# Patient Record
Sex: Male | Born: 1940 | Race: White | Hispanic: No | Smoking: Current every day smoker
Health system: Southern US, Community
[De-identification: ages and names within clinical notes are randomized; demographics above are authoritative.]

## PROBLEM LIST (undated history)

## (undated) DIAGNOSIS — E785 Hyperlipidemia, unspecified: Secondary | ICD-10-CM

## (undated) DIAGNOSIS — I2510 Atherosclerotic heart disease of native coronary artery without angina pectoris: Secondary | ICD-10-CM

## (undated) DIAGNOSIS — I739 Peripheral vascular disease, unspecified: Secondary | ICD-10-CM

## (undated) DIAGNOSIS — I82409 Acute embolism and thrombosis of unspecified deep veins of unspecified lower extremity: Secondary | ICD-10-CM

## (undated) DIAGNOSIS — I480 Paroxysmal atrial fibrillation: Secondary | ICD-10-CM

## (undated) DIAGNOSIS — I422 Other hypertrophic cardiomyopathy: Secondary | ICD-10-CM

## (undated) HISTORY — PX: CATARACT EXTRACTION: SUR2

## (undated) HISTORY — DX: Atherosclerotic heart disease of native coronary artery without angina pectoris: I25.10

## (undated) HISTORY — PX: TONSILLECTOMY: SUR1361

## (undated) HISTORY — DX: Peripheral vascular disease, unspecified: I73.9

## (undated) HISTORY — PX: EYE SURGERY: SHX253

## (undated) HISTORY — DX: Hyperlipidemia, unspecified: E78.5

## (undated) HISTORY — PX: COLONOSCOPY: SHX174

## (undated) HISTORY — DX: Paroxysmal atrial fibrillation: I48.0

---

## 2008-12-01 ENCOUNTER — Ambulatory Visit: Payer: Self-pay | Admitting: Cardiology

## 2008-12-01 ENCOUNTER — Inpatient Hospital Stay (HOSPITAL_COMMUNITY): Admission: EM | Admit: 2008-12-01 | Discharge: 2008-12-03 | Payer: Self-pay | Admitting: Internal Medicine

## 2008-12-05 DIAGNOSIS — I82409 Acute embolism and thrombosis of unspecified deep veins of unspecified lower extremity: Secondary | ICD-10-CM

## 2008-12-05 HISTORY — DX: Acute embolism and thrombosis of unspecified deep veins of unspecified lower extremity: I82.409

## 2008-12-22 ENCOUNTER — Ambulatory Visit: Payer: Self-pay | Admitting: Cardiology

## 2008-12-22 ENCOUNTER — Encounter: Payer: Self-pay | Admitting: Cardiology

## 2008-12-22 ENCOUNTER — Ambulatory Visit: Payer: Self-pay

## 2008-12-22 LAB — CONVERTED CEMR LAB
Free T4: 1 ng/dL (ref 0.6–1.6)
TSH: 4.85 microintl units/mL (ref 0.35–5.50)

## 2008-12-23 ENCOUNTER — Ambulatory Visit: Payer: Self-pay | Admitting: Cardiology

## 2008-12-23 ENCOUNTER — Ambulatory Visit: Payer: Self-pay

## 2009-01-01 ENCOUNTER — Ambulatory Visit: Payer: Self-pay | Admitting: Cardiovascular Disease

## 2009-01-07 ENCOUNTER — Ambulatory Visit (HOSPITAL_COMMUNITY): Admission: RE | Admit: 2009-01-07 | Discharge: 2009-01-07 | Payer: Self-pay | Admitting: Cardiovascular Disease

## 2009-01-07 ENCOUNTER — Ambulatory Visit: Payer: Self-pay | Admitting: Cardiovascular Disease

## 2009-01-15 ENCOUNTER — Ambulatory Visit: Payer: Self-pay

## 2009-01-15 ENCOUNTER — Ambulatory Visit: Payer: Self-pay | Admitting: Cardiovascular Disease

## 2009-06-22 ENCOUNTER — Encounter (INDEPENDENT_AMBULATORY_CARE_PROVIDER_SITE_OTHER): Payer: Self-pay | Admitting: *Deleted

## 2009-07-31 DIAGNOSIS — I2510 Atherosclerotic heart disease of native coronary artery without angina pectoris: Secondary | ICD-10-CM | POA: Insufficient documentation

## 2009-07-31 DIAGNOSIS — I739 Peripheral vascular disease, unspecified: Secondary | ICD-10-CM | POA: Insufficient documentation

## 2009-07-31 DIAGNOSIS — E785 Hyperlipidemia, unspecified: Secondary | ICD-10-CM | POA: Insufficient documentation

## 2009-07-31 DIAGNOSIS — I10 Essential (primary) hypertension: Secondary | ICD-10-CM | POA: Insufficient documentation

## 2009-07-31 DIAGNOSIS — F17200 Nicotine dependence, unspecified, uncomplicated: Secondary | ICD-10-CM

## 2009-10-19 ENCOUNTER — Encounter: Payer: Self-pay | Admitting: Cardiovascular Disease

## 2009-10-19 ENCOUNTER — Ambulatory Visit: Payer: Self-pay

## 2009-10-23 ENCOUNTER — Telehealth: Payer: Self-pay | Admitting: Cardiovascular Disease

## 2009-11-09 ENCOUNTER — Ambulatory Visit: Payer: Self-pay | Admitting: Cardiovascular Disease

## 2009-11-09 ENCOUNTER — Encounter (INDEPENDENT_AMBULATORY_CARE_PROVIDER_SITE_OTHER): Payer: Self-pay

## 2009-11-18 ENCOUNTER — Ambulatory Visit: Payer: Self-pay | Admitting: Cardiovascular Disease

## 2009-11-18 ENCOUNTER — Ambulatory Visit (HOSPITAL_COMMUNITY): Admission: RE | Admit: 2009-11-18 | Discharge: 2009-11-18 | Payer: Self-pay | Admitting: Cardiovascular Disease

## 2009-12-22 ENCOUNTER — Ambulatory Visit (HOSPITAL_COMMUNITY): Admission: RE | Admit: 2009-12-22 | Discharge: 2009-12-22 | Payer: Self-pay | Admitting: Cardiology

## 2009-12-22 ENCOUNTER — Ambulatory Visit: Payer: Self-pay | Admitting: Cardiology

## 2009-12-22 ENCOUNTER — Ambulatory Visit: Payer: Self-pay

## 2009-12-22 ENCOUNTER — Encounter: Payer: Self-pay | Admitting: Cardiology

## 2011-01-04 NOTE — Cardiovascular Report (Signed)
Summary: Pre-Cath Orders  Pre-Cath Orders   Imported By: Marylou Mccoy 12/19/2009 13:00:04  _____________________________________________________________________  External Attachment:    Type:   Image     Comment:   External Document

## 2011-03-02 ENCOUNTER — Other Ambulatory Visit: Payer: Self-pay | Admitting: Cardiovascular Disease

## 2011-03-03 NOTE — Telephone Encounter (Signed)
[address] °

## 2011-03-04 ENCOUNTER — Other Ambulatory Visit: Payer: Self-pay | Admitting: *Deleted

## 2011-03-08 LAB — CBC
HCT: 40.9 % (ref 39.0–52.0)
Platelets: 275 10*3/uL (ref 150–400)
RBC: 4.19 MIL/uL — ABNORMAL LOW (ref 4.22–5.81)
WBC: 11.1 10*3/uL — ABNORMAL HIGH (ref 4.0–10.5)

## 2011-03-08 LAB — BASIC METABOLIC PANEL
BUN: 12 mg/dL (ref 6–23)
Creatinine, Ser: 0.81 mg/dL (ref 0.4–1.5)
GFR calc Af Amer: >60 mL/min (ref >60)
GFR calc non Af Amer: >60 mL/min (ref >60)
Potassium: 3.5 mEq/L (ref 3.5–5.1)

## 2011-03-08 LAB — PROTIME-INR: Prothrombin Time: 13.2 seconds (ref 11.6–15.2)

## 2011-03-19 ENCOUNTER — Other Ambulatory Visit: Payer: Self-pay | Admitting: Cardiovascular Disease

## 2011-03-22 LAB — CBC
Hemoglobin: 13.5 g/dL (ref 13.0–17.0)
RBC: 4.28 MIL/uL (ref 4.22–5.81)

## 2011-03-22 LAB — PROTIME-INR: INR: 1.1 (ref 0.00–1.49)

## 2011-03-22 LAB — BASIC METABOLIC PANEL
Calcium: 9.1 mg/dL (ref 8.4–10.5)
GFR calc Af Amer: >60 mL/min (ref >60)
GFR calc non Af Amer: >60 mL/min (ref >60)
Sodium: 140 mEq/L (ref 135–145)

## 2011-04-18 ENCOUNTER — Other Ambulatory Visit: Payer: Self-pay | Admitting: Cardiovascular Disease

## 2011-04-18 ENCOUNTER — Other Ambulatory Visit: Payer: Self-pay | Admitting: Cardiology

## 2011-04-18 MED ORDER — SIMVASTATIN 40 MG PO TABS: 40.0000 mg | ORAL_TABLET | Freq: Every day | ORAL | Status: DC

## 2011-04-19 ENCOUNTER — Other Ambulatory Visit: Payer: Self-pay | Admitting: *Deleted

## 2011-04-19 MED ORDER — AMLODIPINE BESYLATE 10 MG PO TABS: 10.0000 mg | ORAL_TABLET | Freq: Every day | ORAL | Status: DC

## 2011-04-19 NOTE — Assessment & Plan Note (Signed)
[facility]                            CARDIOLOGY OFFICE NOTE   David Pacheco, David Pacheco                         MRN:          [id number]  DATE:12/22/2008                            DOB:          02-14-1941    PRIMARY CARE PHYSICIAN:  Dr. Kathlee Nations in [place], [place].   HISTORY OF PRESENT ILLNESS:  This is a 70 year old with history of  hypertension and tobacco abuse who presents to Cardiology Clinic for  followup after recent hospitalization for chest pain and tightness.  In  December 2009, the patient began to develop episodes of substernal chest  squeezing.  The chest pain was on and off and would occur multiple times  a day, would last for 10-20 minutes at a time.  This went on for 1-2  weeks.  It was not related to exertion.  The patient presented to the  emergency department here at [facility].  He was found to have  significant dynamic anterolateral ST depression and profound T-wave  inversion.  He was admitted.  He was ruled out for myocardial  infarction.  He did have a left heart catheterization done while he was  in the hospital which showed nonobstructive coronary disease with a  normal EF.  It was thought that perhaps the patient had coronary  vasospasm.  He was put on Norvasc and Imdur and discharged.  Since the  time of the discharge, the patient had only 1 episode of the squeezing  chest tightness.  This was last week.  It lasted only for about 10  seconds, and he has had none since or before.  He does not get any  exertional chest symptoms, and he does not get short of breath with  exertion.  The patient did have an echocardiogram done today which shows  normal LV size and function.  There is no evidence that I can see for  apical hypertrophy which could have also given him the profound T-wave  inversions that he has on the EKG.  He was noted to have a possibly  bicuspid aortic valve.  Finally, the patient does complain of  tightness  in his thighs with moderate exertion.  He says when he is out walking in  the woods hunting he will often 10-15 minutes into a walk in the woods  get significant tightness in his thighs.  He thinks perhaps it is worse  on the left.  This has been going on for at least a couple of years now.  Of note, the patient also does continue to smoke a pack of cigarettes  every 3 days.   PAST MEDICAL HISTORY  1. Hypertension.  2. Tobacco abuse.  3. Chest pain.  The patient did present to the hospital with chest      pain in December 2009.  Left heart catheterization showed separate      ostia of the LAD and the circumflex and nonobstructive coronary      disease.  EF was 65%.  He had an echocardiogram done in January      2010 showing a possible  bicuspid aortic valve with no aortic      stenosis or aortic regurgitation.  Mitral valve was normal.  There      was normal LV systolic and diastolic function.  There was normal RV      size and function.  Pulmonary artery systolic pressure was normal.      The IVC was normal.  There was no evidence for LV apical      hypertrophy either.   MEDICATIONS:  1. Aspirin 325 mg daily.  2. Norvasc 5 mg daily.  3. Imdur 30 mg daily.   SOCIAL HISTORY:  The patient lives in [place], [place].  He is not  married.  He has smoked a pack a day for 40 years though lately he has  cut back to about a pack every 3 days.  He does not drink alcohol.  He  runs heavy equipment part time   FAMILY HISTORY:  The patient had a brother with an MI at the age 13.  He  had another brother who had bypass surgery.  His mother had a stroke.   Review of systems is negative except as noted in the history of present  illness.   Labs on December 03, 2008, creatinine 0.77.  Cardiac enzymes were  negative while in the hospital.  TSH was mildly elevated at 5.795, LDL  was 106, HDL 22, triglycerides 89.   EKG:  Today, normal sinus rhythm with continued marked anterior  lateral  T-wave inversions.   PHYSICAL EXAMINATION:  VITAL SIGNS:  Blood pressure is 142/64, heart  rate 63 and regular.  GENERAL:  This is a well-developed male in no apparent distress.  NEUROLOGIC:  Alert and oriented x3.  Normal affect.  HEENT:  Normal.  LUNGS:  Slightly distant breath sounds bilaterally.  Otherwise, clear  with normal respiratory effort.  CARDIOVASCULAR:  Heart regular.  S1 and S2.  There is a 1/6 crescendo-  decrescendo systolic murmur at the right upper sternal border.  There is  no S3, no S4.  There is no carotid bruit.  There is no peripheral edema.  There is a 2+ posterior tibial pulse on the right.  I am unable to  palpate the left posterior tibial or dorsalis pedis pulses.  There is no  peripheral edema.  NECK:  There is no JVD.  There is no thyromegaly or thyroid nodule.  ABDOMEN:  Soft, nontender.  No hepatosplenomegaly.  Normal bowel sounds.  SKIN:  Normal exam.  MUSCULOSKELETAL:  Normal exam.   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  1. Chest pain.  The patient had only nonobstructive coronary disease.      His echocardiogram showed normal LV systolic and diastolic function      with no evidence for LV apical hypertrophy which could have also      caused the deep T-wave inversions.  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  He has      been essentially asymptomatic since discharge from the hospital.      He is on Norvasc and Imdur.  I am going to continue treatment of      possible vasospasm.  I am going to go ahead and continue him on      those medications today.  It would also be okay for him to decrease      his aspirin dose to 81 mg a day.  2. Possible bicuspid aortic valve.  We did do an echo today which      showed a  possibly bicuspid aortic valve.  There is no evidence for      aortic stenosis or aortic regurgitation.  We will plan on doing a      CTA of his chest to screen for possible thoracic aortic aneurysm      which often accompanies bicuspid aortic valve.  Additionally, we      will plan on a followup echocardiogram in 1 year to assess for any      progression to aortic stenosis.  3. Claudication.  The patient has claudication-type pain in his legs      with decreased pulses in the left leg.  We will go ahead and obtain      arterial Doppler studies to assess for any evidence for peripheral      arterial disease.  4. Hypertension.  The patient's blood pressure is a bit elevated today      at 143/64.  We will increase his Norvasc to 10 mg a day.  5. Smoking.  I did talk to the patient at length about smoking      cessation.  We will give him a prescription for Chantix.  6. Elevated TSH.  The patient's TSH was high while he was in the      hospital.  We will repeat a TSH and free T4 today.     Marca Ancona, MD  Electronically Signed    DM/MedQ  DD: 12/22/2008  DT: 12/22/2008  Job #: [id number]   cc:   Kathlee Nations

## 2011-04-19 NOTE — Letter (Signed)
January 01, 2009    Marca Ancona, MD  [address]  [place], [place] [address]   RE:  David Pacheco, David Pacheco  MRN:  [id number]  /  DOB:  March 17, 1941   Dear Freida Busman,   It was my pleasure to see the Yadiel Aubry on January 01, 2009 for  evaluation of left leg claudication.  As you know, he is a 70 year old  gentleman with hypertension and longstanding tobacco abuse, who was  recently hospitalized with chest pain and underwent a cardiac  catheterization that showed nonobstructive CAD.  He complained of left  leg discomfort predominantly in the left thigh with walking.  He  underwent ABIs that were 1.0 on the right and 0.57 on the left.  His  duplex ultrasound was suggestive of subtotal occlusion in the mid-to-  distal left SFA.  There was also a question of inflow disease on the  left.   He reports left thigh pain with low level activity.  He develops very  early pain when walking up a hill or walking at a brisk pace.  He has  fairly quick resolution of the symptoms with rest.  His symptoms started  about 6 months ago.  Prior to that, he does not recall having problems.  He denies right leg symptoms.  He has no rest pain or ulcerations.  He  has no history of stroke or TIA.  He has no other complaints at this  time.  He has cut back on cigarettes, but still continues to smoke.   MEDICATIONS:  1. Imdur 30 mg daily.  2. Aspirin 325 mg daily.  3. Norvasc 10 mg daily.  4. Zocor 40 mg daily.   ALLERGIES:  NKDA.   PAST MEDICAL HISTORY:  1. Essential hypertension.  2. Long-standing tobacco abuse.  3. Nonobstructive coronary artery disease.  4. Lower extremity peripheral arterial disease as outlined above.   SOCIAL HISTORY:  The patient lives just outside of a [place],  [place].  He has been smoking for 40 years and has recently cut back to  about of third a pack per day.  He does not drink alcohol.  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   FAMILY  HISTORY:  Brother had an MI at age 30, another brother had  coronary bypass surgery.  There is no history of lower extremity PAD in  the family.   REVIEW OF SYSTEMS:  A 12-point review of systems was performed.  Other  than his recent episode of chest pain , it was negative except as  outlined in the HPI.   PHYSICAL EXAMINATION:  GENERAL:  The patient is alert and oriented.  He  is in no acute distress.  VITAL SIGNS:  Weight is 144 pounds, blood pressure is 150/60, heart rate  72, and respiratory rate 16.  HEENT:  Normal.  NECK:  Normal carotid upstrokes.  No bruits.  JVP is normal.  No  thyromegaly or thyroid nodules.  LUNGS:  Clear bilaterally with prolonged expiratory phase.  HEART:  The apex is discreet and nondisplaced.  There is a soft systolic  ejection murmur at the right upper sternal border.  There are no  diastolic murmurs or gallops.  ABDOMEN:  Soft and nontender.  No organomegaly.  No abdominal bruits.  BACK:  No CVA tenderness.  EXTREMITIES:  The right leg has 2+ pulses in the femoral popliteal, DP,  and PT with a prominent femoral bruit.  On the  left, the femoral pulse  is 1+ .  The popliteal is not palpable.  There is a trace dorsalis pedis  and the posterior tibial is not palpable.  There is no clubbing,  cyanosis, or edema.  SKIN:  Warm and dry.  There are no ulcerations.  NEUROLOGIC:  Cranial nerves II through XII are intact.  Strength is  intact and equal.   EKG done at previous office visit showed normal sinus rhythm with marked  ST and T-wave abnormality in the anterolateral leads, ABI, and lower  extremity duplex as described in the HPI.  Echo from December 22, 2008  showed no evidence of LV apical hypertrophy.  The EF was 60%.   ASSESSMENT:  This is a 70 year old gentleman with lower extremity  peripheral arterial disease and lifestyle-limiting intermittent  claudication.  He has significant limitation and evidence of disease in  the left of significant  disease in the left superficial femoral artery.  He also may have inflow disease based on his reduced left femoral pulse.  I discussed potential treatment options and plan on angiography with an  eye toward percutaneous transluminal angioplasty.  Risks and indications  were reviewed in detail with the patient.  He understands and agrees to  proceed.  He was given a sample of Plavix and asked to take 300 mg a day  before the procedure.  We will start 75 mg daily at the time of his  angiogram.  He was counseled regarding tobacco abuse and detrimental  effects on his peripheral vasculature.  We will follow up with him at  the time of his angiogram and will make further decisions based on his  anatomy.   Thanks for asking me to see Mr. Maclaren.  I will be in touch at the time  of his angiogram.    Sincerely,      Veverly Fells. Excell Seltzer, MD  Electronically Signed    MDC/MedQ  DD: 01/01/2009  DT: 01/02/2009  Job #: [id number]   CC:    Kathlee Nations

## 2011-04-19 NOTE — H&P (Signed)
David Pacheco, David Pacheco NO.:  [id number]   MEDICAL RECORD NO.:  [id number]          PATIENT TYPE:  INP   LOCATION:  6524                         FACILITY:  [facility]   PHYSICIAN:  Marca Ancona, MD      DATE OF BIRTH:  1941/07/23   DATE OF ADMISSION:  12/01/2008  DATE OF DISCHARGE:                              HISTORY & PHYSICAL   PRIMARY CARE PHYSICIAN:  Kathlee Nations, [place], [place].   The patient does not have a cardiologist.   HISTORY OF PRESENT ILLNESS:  This is a 70 year old male with no history  of coronary artery disease who presents with episodes of chest pain.  The patient states that he has had chest pain on and off x1 week.  The  pain is left-sided pressure with no radiation.  The pain is not related  to exertion.  The patient cannot tell what brings it on.  He had a  severe episode last night that woke him up from sleep.  This resolved  after approximately 1 hour.  The pain returned again this morning and  lasted several hours.  It was severe; therefore, he went to the  emergency department in [place].  The pain resolved in the emergency  department with nitroglycerin paste.  It returned later on during the  day and then resolved with a nitroglycerin drip.  EKG showed  anterolateral T-wave inversions and ST depressions.  These changes did  develop while he was in the hospital as his initial EKG was relatively  normal looking.  His first set of cardiac enzymes is negative in  [place].  At baseline, the patient has good exercise tolerance with no  dyspnea on exertion.  He has had no prior chest pain.  He does report  some pain in his calves bilaterally with walking long distances in the  emergency department.  At [facility], the patient received aspirin 325 mg  and was started on heparin drip and received nitroglycerin drip and  started on nitroglycerin drip at 10 mcg per minute.   PAST MEDICAL HISTORY:  1. Hypertension.  2. Tobacco abuse.   MEDICATIONS:  1. Norvasc 5 mg daily.  2. Hydrochlorothiazide 25 mg daily.   SOCIAL HISTORY:  The patient lives in [place], [place], by  himself.  He is retired.  He smokes a pack a day and has done so for  over 40 years.  He does not use any alcohol or any drugs.   FAMILY HISTORY:  The patient's mother had a stroke and he has a brother  who had bypass surgery at age of 24.  Another brother died of heart  attack at age 19.   REVIEW OF SYSTEMS:  Negative except as noted in history of present  illness.  Chest x-ray shows clear lung fields.  EKG shows profound  anterior and anterolateral T-wave inversions with approximately 1 mm ST  depression suggestive of ischemia.   LABORATORY DATA:  White count 10.8, hematocrit 42, platelets 334,  potassium 3.8, creatinine 0.9, CK 185, CK-MB 1.0, troponin less than  0.01.   PHYSICAL EXAMINATION:  VITAL  SIGNS:  Temperature is 97.9, pulse is 68  and regular, and blood pressure is 134/51.  GENERAL:  This is a well-developed male in no apparent distress.  NEUROLOGIC:  Alert and oriented x3.  Normal affect.  HEENT:  Normal exam.  ABDOMEN:  Soft and nontender.  No hepatosplenomegaly.  Normal bowel  sounds.  NECK:  Supple without lymphadenopathy.  No thyromegaly.  There is no  JVD.  CARDIOVASCULAR:  Heart regular S1 and S2.  No S3, no S4, no murmur.  There is no edema.  There is 2+ right posterior tibial pulse.  I am  unable to palpate the left posterior tibial pulse.  LUNGS:  Clear to auscultation bilaterally with normal respiratory  effort.  MUSCULOSKELETAL:  Normal exam.  SKIN:  Normal exam.  EXTREMITIES:  No clubbing or cyanosis.   ASSESSMENT AND PLAN:  This is a 70 year old with a history of  hypertension, tobacco abuse, and family history of premature coronary  artery disease who presents with progressive chest pain and EKG changes.  First set of cardiac enzymes was negative.  1. Chest pain.  This is a probable acute coronary syndrome.   The      patient does have multiple risk factors.  He does have worrisome      EKG.  We will plan on a left heart catheterization in the morning.      For now, we will continue him on his nitroglycerin drip.  He is      currently completely chest pain free.  He will be on aspirin, beta-      blocker, statin and a heparin drip.  We will check further cardiac      enzymes.  If his enzymes become positive, I will go ahead and load      him on Plavix before his heart catheterization.  2. Leg pain.  This does sound like claudication.  The patient should      have ABIs done at some point.  He has decreased pulses in his left      foot, but the pulses on his right are normal.  3..  Encourage the patient to stop smoking completely.      Marca Ancona, MD  Electronically Signed     DM/MEDQ  D:  12/01/2008  T:  12/02/2008  Job:  [id number]   cc:   Kathlee Nations

## 2011-04-19 NOTE — Cardiovascular Report (Signed)
David Pacheco, David Pacheco NO.:  [id number]   MEDICAL RECORD NO.:  [id number]          PATIENT TYPE:  INP   LOCATION:  6524                         FACILITY:  [facility]   PHYSICIAN:  Rollene Rotunda, MD, FACCDATE OF BIRTH:  1941/07/28   DATE OF PROCEDURE:  12/02/2008  DATE OF DISCHARGE:                            CARDIAC CATHETERIZATION   PRIMARY CARE PHYSICIAN:  Dr. Kathlee Nations, [place], [place].   CARDIOLOGIST:  Marca Ancona, MD   REASON FOR PRESENTATION:  Evaluate the patient with chest pain and an  abnormal EKG suggestive of an acute coronary event.   PROCEDURE NOTE:  Left heart catheterization performed via the right  femoral artery.  The artery was cannulated using anterior wall puncture.  A #5-French arterial sheath was inserted via the modified Seldinger  technique.  Preformed Judkins and a pigtail catheter were utilized.  The  patient tolerated the procedure well and left the lab in stable  condition.   RESULTS:  Hemodynamics; LV 121/2, AO 119/52.   CORONARIES:  Left main was nonexistent as there were separate ostia.  The LAD had a long proximal 30% stenosis.  There were mid diffuse  luminal irregularities.  The vessel was large wrapping the apex.  First  diagonal was large with luminal irregularities.  The circumflex was a  dominant vessel.  The AV groove was normal.  There was a first obtuse  marginal, which was large with ostial 30% stenosis.  There were 2 small  posterolaterals, which were normal.  There was a PDA, which was moderate  sized and normal.  The right coronary artery was a nondominant vessel.  It was normal throughout its course.  Left ventriculogram:  Left  ventriculogram was obtained in the RAO projection.  The EF of 65% with  normal wall motion.   CONCLUSION:  Nonobstructive coronary artery disease.  He does have chest  pain, which is somewhat atypical.  His EKG is remarkable with dynamic T-  wave inversions.  The question of  coronary spasm cannot be resolved with  this procedure.   PLAN:  We will manage the patient presumably for coronary spasm while we  ask his primary care doctor to consider other etiologies of his chest  discomfort.      Rollene Rotunda, MD, [facility]  Electronically Signed     JH/MEDQ  D:  12/02/2008  T:  12/03/2008  Job:  [id number]   cc:   Kathlee Nations

## 2011-04-19 NOTE — Progress Notes (Signed)
[facility]                        PERIPHERAL VASCULAR OFFICE NOTE   [name], [name]                         MRN:          [id number]  DATE:01/15/2009                            DOB:          1941/11/27    REASON FOR VISIT:  Lower extremity PAD with intermittent claudication.   HISTORY OF PRESENT ILLNESS:  David Pacheco is a 70 year old gentleman who  recently underwent left superficial femoral artery PTA and stenting.  He  had typical symptoms of left leg claudication.  He underwent angiography  on February 3 that showed a long total occlusion of the left superficial  femoral artery.  This was recanalized percutaneously and was treated  with overlapping self-expanding stents.  His preprocedure left ABI was  0.57.  He underwent ABIs today and it is in the left ABI is now 1.0.   From a symptomatic standpoint, he complains of left lower extremity  swelling, since his revascularization procedure involving the ankle and  foot.  He has had no other complaints.  He has not done a lot of walking  because he was concerned about the swelling.  He denies rest pain or  ulcerations.   CURRENT MEDICATIONS:  1. Imdur 30 mg daily.  2. Aspirin 325 mg daily.  3. Norvasc 10 mg daily.  4. Zocor 40 mg daily.  5. Plavix 75 mg daily.   ALLERGIES:  NKDA.   PHYSICAL EXAMINATION:  GENERAL:  On exam, the patient is alert and  oriented.  He is in no acute distress.  VITAL SIGNS:  Weight is 142 pounds, blood pressure 122/62 on the right,  122/64 on the left, heart rate 72.  ABDOMEN:  Soft, nontender.  EXTREMITIES:  Femoral pulses are 2+.  The posterior tibial and dorsalis  pedis pulses are 2+ and equal bilaterally.  SKIN:  Warm and dry.  The left leg has 1+ edema in the foot.  There is  no rash.  The skin is warmer on the left than the right.   ABI and left leg duplex just completed for postprocedure.  Baseline  showed an ABI of 1.1 on the right and 1.0 on the left.   There was brisk  flow throughout the left SFA with a widely patent stent.  There was no  evidence of left lower extremity DVT.   ASSESSMENT:  Lower extremity peripheral arterial disease.  The patient's  exam and noninvasive study suggests excellent perfusion of the left leg  following stenting.  He likely has edema secondary to reperfusion injury  which is common phenomenon after opening a total occlusion and I advised  this would continue to improve over the next 1-2 weeks and should be  resolved by that point.  He was encouraged to begin a walking program  and the importance of smoking cessation was reviewed.  He is unable to  obtain Plavix because of cost.  He was given a 14-day voucher when he  left the hospital.  We gave him an extra 16 days of samples today, so  that he can at least complete a 30-day course.  He should  remain on  aspirin lifelong.   For followup, I would like to see him back in 6 months with a  surveillance ultrasound at that point.     David Pacheco. Excell Seltzer, MD  Electronically Signed    MDC/MedQ  DD: 01/15/2009  DT: 01/16/2009  Job #: [id number]   cc:   Marca Ancona, MD  Kathlee Nations  Catalina Pizza, M.D.

## 2011-04-19 NOTE — Discharge Summary (Signed)
David Pacheco, [name] NO.:  [id number]   MEDICAL RECORD NO.:  [id number]          PATIENT TYPE:  INP   LOCATION:  6524                         FACILITY:  [facility]   PHYSICIAN:  Rollene Rotunda, MD, FACCDATE OF BIRTH:  Dec 10, 1940   DATE OF ADMISSION:  12/01/2008  DATE OF DISCHARGE:  12/03/2008                               DISCHARGE SUMMARY   PRIMARY CARDIOLOGIST:  Marca Ancona, MD   PRIMARY CARE PHYSICIAN:  Kathlee Nations.   PROCEDURES PERFORMED DURING HOSPITALIZATION:  Cardiac catheterization  dated December 02, 2008, performed by Dr. Rollene Rotunda, revealing  nonobstructive CAD.   Left main was nonexistent as there were separate ostia, left anterior  descending had long proximal 30% stenosis with mid diffuse luminal  irregularities.  The vessel was large wrapping the apex, the first  diagonal was large with luminal irregularities.  The circumflex was a  dominant vessel, the AV groove was normal.  There was a first obtuse  marginal, which was large with ostial 30% stenosis.  There were 2 small  posterior laterals, which were normal.  There was a PDA, which was  moderate size and normal.  The right coronary artery was a nondominant  vessel.  It was normal through its course.  LVEF was 65% with normal  wall motion abnormality.   FINAL DISCHARGE DIAGNOSES:  1. Chest pain.  A:  Status post cardiac catheterization revealing non obstructive  coronary artery disease.  1. Questionable coronary artery spasm.  2. Elevated TSH to be followed as an outpatient.  3. Dyslipidemia.   HOSPITAL COURSE:  This is a 70 year old male with no prior coronary  artery disease history who presented with episodes of chest pain in  [facility].  The patient's EKG showed anterior lateral T-wave  inversions and ST depression, which developed during hospitalization at  [facility], as a result of this the patient was transferred to [facility] for cardiac catheterization.  Prior  to this, the patient was  placed on heparin drip with a nitroglycerin drip.   The patient was seen and examined by Dr. Rollene Rotunda on admission to  [facility] prior to cardiac catheterization.  Risks and benefits of  cardiac catheterization were discussed and the patient was willing to  proceed.  The patient underwent cardiac catheterization, which revealed  nonobstructive CAD.  Please see Dr. Jenene Slicker thorough cardiac  catheterization note and review catheterization note above.  The patient  was started on Imdur 30 mg p.o. daily, nitroglycerin and heparin were  discontinued.  TSH was found to be elevated at 5.795.  The patient had  no recurrence of chest discomfort.  The patient will follow up with Dr.  Maryellen Pile, primary care physician in [place] for continued evaluation  of TSH.  The patient we will continue his Norvasc on discharge.  We did  review chest x-ray report from [facility] revealing no active  disease.  The patient will follow up with Dr. Marca Ancona for an  echocardiogram to evaluate for LV function and apical hypertrophy and  see Dr. Shirlee Latch the same day.  In the  interim, the patient will have his  HCTZ discontinued.   DISCHARGE LABORATORIES:  Sodium 139, potassium 3.8, chloride 110, CO2 of  20, BUN 11, and creatinine 0.77.  Hemoglobin 12.4, white blood cells  10.1, and platelets 283.   EKG revealing normal sinus rhythm with anterior lateral T-wave  inversion.   PHYSICAL EXAMINATION:  VITAL SIGNS:  On discharge, blood pressure  123/52, heart rate 65, respirations 20, temperature 98.7, and O2 sat 97%  on room air.   DISCHARGE MEDICATIONS:  1. Norvasc 5 mg 1 p.o. daily.  2. Imdur 30 mg 1 p.o. daily.  3. Enteric-coated aspirin 325 mg daily.   The patient has been advised to stop hydrochlorothiazide.   FOLLOWUP PLANS AND APPOINTMENTS:  1. The patient will follow up with Dr. Marca Ancona in [place] on      December 23, 2007, at 8:30 a.m. for  echocardiogram.  2. The patient will follow up with Dr. Shirlee Latch the same day at 11:45      for office appointment to discuss results and continued cardiac      management.  3. The patient has been given post cardiac catheterization      instructions with particular emphasis on the right groin site for      evidence of bleeding, hematoma, or signs of infection.  4. The patient has been advised to discontinue hydrochlorothiazide.   TIME SPENT WITH THE PATIENT TO INCLUDE:  Physician time 35 minutes.      Bettey Mare. Lyman Bishop, NP      Rollene Rotunda, MD, [facility]  Electronically Signed    KML/MEDQ  D:  12/03/2008  T:  12/03/2008  Job:  [id number]   cc:   Kathlee Nations

## 2011-04-19 NOTE — Op Note (Signed)
David Pacheco, David Pacheco                ACCOUNT NO.:  [id number]   MEDICAL RECORD NO.:  [id number]          PATIENT TYPE:  AMB   LOCATION:  DAY                          FACILITY:  [facility]   PHYSICIAN:  Veverly Fells. Excell Seltzer, MD  DATE OF BIRTH:  Oct 11, 1941   DATE OF PROCEDURE:  01/07/2009  DATE OF DISCHARGE:                               OPERATIVE REPORT   PROCEDURE:  1. Abdominal aortic angiogram.  2. Left external iliac angiogram with runoff to the foot.  3. Left superficial femoral artery percutaneous transluminal      angioplasty and stenting.  4. Perclose of the right femoral artery.   INDICATIONS:  Mr. Robideau is a 70 year old gentleman with lower  extremity peripheral arterial disease and lifestyle-limiting  claudication.  He has symptoms with very low level activity.  His ABIs  were 1.0 on the right and 0.57 on the left with evidence of subtotal  occlusion in the left superficial femoral artery.  He was brought in for  angiography and possible PTA.   Risks and indications of procedure were reviewed with the patient.  Informed consent was obtained.  The right groin was prepped and draped,  anesthetized with 1% lidocaine using modified Seldinger technique.  A 5-  French sheath was placed in the right femoral artery.  A 5-French  pigtail catheter was advanced to the suprarenal aorta where an abdominal  aortogram was performed under digital subtraction.  Pigtail catheter was  then brought down to the distal aorta and angulated views over the  aortoiliac arteries were performed.  They demonstrated nonobstructive  iliac disease on the left.  At that point, I elected to crossover, so  that selective angiography of the left leg will be performed.  A 5-  [place] crossover catheter was used and a Wholey wire was advanced down  into the common femoral artery where the catheter was then changed out  for a 5-French end-hole catheter.  The left external iliac angiogram was  then performed with  runoff to the left foot.  This demonstrated total  occlusion of the left superficial femoral artery just beyond the ostium.  The profunda was widely patent and the SFA reconstituted above the knee  just going into Hunter canal.  There was delayed runoff, but there  appeared to be 3-vessel runoff to the left foot.  At that point, I  elected to attempt to recanalize the SFA and treat the total occlusion.  Heparin was used for anticoagulation.  The 5-French sheath was changed  out for a 7 [place] to remove destination sheath, which was placed in the  left external iliac artery.  An angled Glidewire was used to go  subintimal and an end-hole catheter was advanced down back into the  intraluminal distal superficial femoral artery.  Angiography through the  end-hole catheter was performed that demonstrated intraluminal position.  At that point, a Wholey wire was advanced into the left popliteal artery  and that wire was used to work over.  The initial ACT came back at 196.  An additional 2000 units of heparin were given.  A 5  x 120-mm Dorado  balloon was used for predilatation.  Two inflations were done to 5  atmospheres to cover the entire length of the total occlusion.  Following balloon dilatation, there was restoration of antegrade flow  down the SFA.  There was significant residual stenosis and dissection  that I elected to treat with stenting.  A 7 x 170-mm light stent was  placed in the distal superficial femoral artery and deployed without  difficulty.  We did not have the proper length of stent to cover the  remaining portion of SFA, so I used a 7 x 80-mm EverFlex Protege stent  which, was placed in overlapping fashion just back to the ostium of the  superficial femoral artery.  The stented segment was then postdilated  with the same 5 x 120-mm Dorado balloon, which was taken to 16  atmospheres on 2 inflations.  At the completion of the procedure, the  angiographic result was excellent  throughout the stented segment.  The  ostium was fully covered in the stent just overlap the profunda.  However, on the distal end of the stent, there was significant residual  stenosis.  Intra-arterial nitroglycerin was given, but the angiographic  appearance did not change.  I elected to do a low-pressure balloon  inflation to treat what I suspected was an uncovered balloon dissection.  The 5 x 120-mm Dorado balloon was advanced back down and a 2-minute  inflation was done to 2 atmospheres.  Following that low-pressure  inflation, the appearance was greatly improved and there was normal flow  with no significant residual stenosis.  The sheath was pulled back using  the [facility] wire into the ipsilateral right external iliac artery where  an external iliac angiogram was performed to assess for closing the  vessel.  I elected to close the femoral arteriotomy with a Perclose  device, which was used successfully.  The patient tolerated the entire  procedure well.  There were no immediate complications.  At the  completion of the procedure, there was a 2+ dorsalis pedis and posterior  tibial pulse in the left foot.   FINDINGS:  Abdominal aorta widely patent.  There were single renal  arteries bilaterally.  Both were widely patent.   RIGHT LEG:  The right common iliac artery has no significant stenosis.  The right external iliac artery was widely patent.  The right internal  iliac artery was widely patent.  There was minor luminal irregularity in  the right external iliac artery, but no significant stenosis.   LEFT LEG:  The left common iliac is patent.  Just at the bifurcation of  the internal and external iliac artery, there was a 50% eccentric  stenosis.  The internal iliac was widely patent.  The external iliac was  patent down to the distal vessel at the junction of the external iliac  and common femoral where there was another 50% stenosis.  The common  femoral was patent.  The  profunda was widely patent.  The superficial  femoral artery was occluded just beyond the ostium.  It reconstitutes  just at the entry into the abductor canal.  The popliteal artery was  patent.  There was 3-vessel runoff to the foot.   FINAL CONCLUSION:  1. 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  2. Successful percutaneous transluminal angioplasty and stenting of      the left superficial femoral artery.   PLAN:  The patient will be discharged home this afternoon.  He should  continue on aspirin and Plavix.  We will follow up with a duplex  ultrasound and ABI in approximately 4 weeks.      Veverly Fells. Excell Seltzer, MD  Electronically Signed     MDC/MEDQ  D:  01/07/2009  T:  01/08/2009  Job:  [id number]   cc:   Marca Ancona, MD  Kathlee Nations

## 2011-04-29 ENCOUNTER — Other Ambulatory Visit: Payer: Self-pay | Admitting: *Deleted

## 2011-04-29 MED ORDER — LISINOPRIL 10 MG PO TABS: 10.0000 mg | ORAL_TABLET | Freq: Every day | ORAL | Status: DC

## 2011-05-31 ENCOUNTER — Other Ambulatory Visit: Payer: Self-pay | Admitting: Cardiovascular Disease

## 2011-05-31 ENCOUNTER — Telehealth: Payer: Self-pay | Admitting: Cardiovascular Disease

## 2011-05-31 NOTE — Telephone Encounter (Signed)
Spoke with pharmacist who gave me information regarding interaction of amlodipine 10 daily and simvastatin 40 daily. They have faxed refill request for amlodipine to [place] today.  Upon review of chart pt has not been seen in follow up recently. Last prescription refill sent to pharmacy in 2/12 had information on it that pt would need to contact Dr. Excell Seltzer and schedule follow up appt before prescription could be refilled again.  Pharmacist given this information and will notify pt that he needs to call to schedule appt before any refills can be done.

## 2011-05-31 NOTE — Telephone Encounter (Signed)
Pharmacy has questions regarding medication refill on this patient and the drug interactions.  Please call them today if possible.

## 2011-06-01 ENCOUNTER — Other Ambulatory Visit: Payer: Self-pay | Admitting: Adult Health

## 2011-06-01 NOTE — Telephone Encounter (Signed)
Dr. Excell Seltzer 's pt

## 2011-06-28 ENCOUNTER — Other Ambulatory Visit: Payer: Self-pay | Admitting: Cardiovascular Disease

## 2011-06-29 ENCOUNTER — Other Ambulatory Visit: Payer: Self-pay | Admitting: Cardiovascular Disease

## 2011-06-30 NOTE — Telephone Encounter (Signed)
[place] pt. 

## 2011-07-04 ENCOUNTER — Encounter: Payer: Self-pay | Admitting: Cardiovascular Disease

## 2011-07-31 ENCOUNTER — Other Ambulatory Visit: Payer: Self-pay | Admitting: Cardiovascular Disease

## 2011-08-02 ENCOUNTER — Ambulatory Visit: Payer: Self-pay | Admitting: Cardiovascular Disease

## 2011-08-04 ENCOUNTER — Other Ambulatory Visit: Payer: Self-pay | Admitting: Cardiovascular Disease

## 2011-08-26 ENCOUNTER — Ambulatory Visit: Payer: Self-pay | Admitting: Cardiovascular Disease

## 2011-08-31 ENCOUNTER — Other Ambulatory Visit: Payer: Self-pay | Admitting: Cardiovascular Disease

## 2011-09-09 LAB — CBC
HCT: 37 % — ABNORMAL LOW (ref 39.0–52.0)
HCT: 37.4 % — ABNORMAL LOW (ref 39.0–52.0)
MCV: 96.5 fL (ref 78.0–100.0)
Platelets: 283 10*3/uL (ref 150–400)
Platelets: 291 10*3/uL (ref 150–400)
Platelets: 307 10*3/uL (ref 150–400)
RDW: 13.1 % (ref 11.5–15.5)
RDW: 13.5 % (ref 11.5–15.5)
RDW: 13.6 % (ref 11.5–15.5)
WBC: 10.1 10*3/uL (ref 4.0–10.5)
WBC: 11.3 10*3/uL — ABNORMAL HIGH (ref 4.0–10.5)

## 2011-09-09 LAB — BASIC METABOLIC PANEL
BUN: 11 mg/dL (ref 6–23)
Calcium: 8.6 mg/dL (ref 8.4–10.5)
GFR calc non Af Amer: >60 mL/min (ref >60)
Glucose, Bld: 101 mg/dL — ABNORMAL HIGH (ref 70–99)
Potassium: 3.8 mEq/L (ref 3.5–5.1)

## 2011-09-09 LAB — CARDIAC PANEL(CRET KIN+CKTOT+MB+TROPI)
CK, MB: 1 ng/mL (ref 0.3–4.0)
CK, MB: 1.2 ng/mL (ref 0.3–4.0)
Relative Index: 0.7 (ref 0.0–2.5)
Relative Index: 0.7 (ref 0.0–2.5)
Total CK: 174 U/L (ref 7–232)
Troponin I: <0.01 ng/mL (ref 0.00–0.06)
Troponin I: <0.01 ng/mL (ref 0.00–0.06)

## 2011-09-09 LAB — TSH: TSH: 5.795 u[IU]/mL — ABNORMAL HIGH (ref 0.350–4.500)

## 2011-09-09 LAB — LIPID PANEL: VLDL: 18 mg/dL (ref 0–40)

## 2011-09-09 LAB — HEPARIN LEVEL (UNFRACTIONATED): Heparin Unfractionated: 0.35 IU/mL (ref 0.30–0.70)

## 2011-09-13 ENCOUNTER — Other Ambulatory Visit: Payer: Self-pay | Admitting: Cardiovascular Disease

## 2011-09-29 ENCOUNTER — Ambulatory Visit (INDEPENDENT_AMBULATORY_CARE_PROVIDER_SITE_OTHER): Payer: Medicare Other | Admitting: Cardiovascular Disease

## 2011-09-29 ENCOUNTER — Encounter: Payer: Self-pay | Admitting: Cardiovascular Disease

## 2011-09-29 DIAGNOSIS — I739 Peripheral vascular disease, unspecified: Secondary | ICD-10-CM

## 2011-09-29 DIAGNOSIS — I10 Essential (primary) hypertension: Secondary | ICD-10-CM

## 2011-09-29 DIAGNOSIS — R9431 Abnormal electrocardiogram [ECG] [EKG]: Secondary | ICD-10-CM

## 2011-09-29 DIAGNOSIS — I70219 Atherosclerosis of native arteries of extremities with intermittent claudication, unspecified extremity: Secondary | ICD-10-CM

## 2011-09-29 MED ORDER — ISOSORBIDE MONONITRATE ER 30 MG PO TB24: 30.0000 mg | ORAL_TABLET | Freq: Every day | ORAL | Status: DC

## 2011-09-29 MED ORDER — AMLODIPINE BESYLATE 10 MG PO TABS: 10.0000 mg | ORAL_TABLET | Freq: Every day | ORAL | Status: DC

## 2011-09-29 MED ORDER — PRAVASTATIN SODIUM 80 MG PO TABS: 80.0000 mg | ORAL_TABLET | Freq: Every day | ORAL | Status: DC

## 2011-09-29 NOTE — Patient Instructions (Addendum)
Your physician has requested that you have a lower extremity arterial duplex. This test is an ultrasound of the arteries in the legs. It looks at arterial blood flow in the legs. Allow one hour for Lower Arterial scans. There are no restrictions or special instructions  Your physician has requested that you have an ankle brachial index (ABI). During this test an ultrasound and blood pressure cuff are used to evaluate the arteries that supply the arms and legs with blood. Allow thirty minutes for this exam. There are no restrictions or special instructions.  Your physician has requested that you have a lexiscan myoview. For further information please visit [email or web address] Please follow instruction sheet, as given.  Your physician recommends that you schedule a follow-up appointment in: 4-6 WEEKS  Your physician has recommended you make the following change in your medication: STOP Simvastatin, START Pravastatin 80mg  once a day

## 2011-09-30 ENCOUNTER — Encounter: Payer: Self-pay | Admitting: Cardiovascular Disease

## 2011-09-30 DIAGNOSIS — R9431 Abnormal electrocardiogram [ECG] [EKG]: Secondary | ICD-10-CM | POA: Insufficient documentation

## 2011-09-30 NOTE — Assessment & Plan Note (Signed)
The patient has a known history of peripheral arterial disease with stenting of his entire SFA. He has left calf claudication that is moderately lifestyle limiting. Recommend repeat duplex ultrasound to evaluate for in-stent restenosis or inflow disease. He does have palpable pulses in his feet. The fact that he has both thigh and calf discomfort raises the possibility of inflow disease. He will continue on his medical program. Tobacco cessation counseling was done.

## 2011-09-30 NOTE — Progress Notes (Signed)
HPI:  David Pacheco is a 70 year old gentleman presenting for followup of peripheral arterial disease. He has a history of long total a left SFA occlusion and was treated with percutaneous stenting. The patient complains of left thigh and calf claudication with walking. This occurs at about 100-200 yards. He is limited by left calf pain and this resolves with rest. He describes this as an ache. He denies rest pain or ulceration on his feet. He has no right leg pain. He denies chest pain but has mild chronic dyspnea. He denies edema, orthopnea, or PND. He's had no lightheadedness or syncope.  Outpatient Encounter Prescriptions as of 09/29/2011  Medication Sig Dispense Refill  . amLODipine (NORVASC) 10 MG tablet Take 1 tablet (10 mg total) by mouth daily.  30 tablet  11  . aspirin EC 325 MG tablet Take 325 mg by mouth daily.        . cholecalciferol (VITAMIN D) 1000 UNITS tablet Take 1,000 Units by mouth daily.        . isosorbide mononitrate (IMDUR) 30 MG 24 hr tablet Take 1 tablet (30 mg total) by mouth daily.  30 tablet  11  . lisinopril (PRINIVIL,ZESTRIL) 10 MG tablet Take 1 tablet (10 mg total) by mouth daily.  30 tablet  5  . DISCONTD: amLODipine (NORVASC) 10 MG tablet TAKE ONE TABLET BY MOUTH DAILY  30 tablet  0  . DISCONTD: isosorbide mononitrate (IMDUR) 30 MG 24 hr tablet TAKE ONE TABLET BY MOUTH DAILY  30 tablet  0  . DISCONTD: simvastatin (ZOCOR) 40 MG tablet Take 1 tablet (40 mg total) by mouth at bedtime.  30 tablet  5  . pravastatin (PRAVACHOL) 80 MG tablet Take 1 tablet (80 mg total) by mouth daily.  30 tablet  11  . DISCONTD: isosorbide mononitrate (IMDUR) 30 MG 24 hr tablet TAKE ONE TABLET BY MOUTH DAILY  30 tablet  0    No Known Allergies  Past Medical History  Diagnosis Date  . CAD (coronary artery disease)     nonobstructive  . PVD (peripheral vascular disease)     lower extremity PAD eith intermittent claudication  . Essential hypertension   . Dyslipidemia     ROS:  Negative except as per HPI  BP 138/60  Pulse 61  Resp 18  Ht 5\' 6"  (1.676 m)  Wt 145 lb 12.8 oz (66.134 kg)  BMI 23.53 kg/m2  PHYSICAL EXAM: Pt is alert and oriented, NAD HEENT: normal Neck: JVP - normal, carotids 2+= without bruits Lungs: CTA bilaterally CV: RRR without murmur or gallop Abd: soft, NT, Positive BS, no hepatomegaly Ext: no C/C/E, DP and PT pulses are 2+ and equal bilaterally Skin: warm/dry no rash  EKG:  Normal sinus rhythm 61 beats per minute, marked ST and T wave abnormality consider anterolateral ischemia, prolonged QT  ASSESSMENT AND PLAN:

## 2011-09-30 NOTE — Assessment & Plan Note (Signed)
Blood pressure is well controlled on combination of amlodipine, isosorbide, and lisinopril.

## 2011-09-30 NOTE — Assessment & Plan Note (Signed)
The patient has a history of nonobstructive coronary artery disease by cardiac catheterization several years ago. His EKG is markedly abnormal and I would recommend a repeat Myoview stress scan to rule out significant cardiac ischemia. He has multiple ongoing risk factors including hypertension, hyperlipidemia, known peripheral arterial disease, and continued tobacco.

## 2011-10-26 ENCOUNTER — Other Ambulatory Visit: Payer: Self-pay | Admitting: Cardiovascular Disease

## 2011-10-26 NOTE — Telephone Encounter (Signed)
..   Requested Prescriptions   Pending Prescriptions Disp Refills  . lisinopril (PRINIVIL,ZESTRIL) 10 MG tablet [Pharmacy Med Name: LISINOPRIL 10MG  TABLETS] 30 tablet 3    Sig: TAKE 1 TABLET BY MOUTH DAILY   E-scribe to [employer].

## 2011-11-01 ENCOUNTER — Ambulatory Visit (HOSPITAL_COMMUNITY): Payer: PRIVATE HEALTH INSURANCE | Attending: Cardiology | Admitting: Radiology

## 2011-11-01 ENCOUNTER — Encounter (INDEPENDENT_AMBULATORY_CARE_PROVIDER_SITE_OTHER): Payer: PRIVATE HEALTH INSURANCE | Admitting: *Deleted

## 2011-11-01 VITALS — Ht 67.0 in | Wt 148.0 lb

## 2011-11-01 DIAGNOSIS — R002 Palpitations: Secondary | ICD-10-CM | POA: Insufficient documentation

## 2011-11-01 DIAGNOSIS — I739 Peripheral vascular disease, unspecified: Secondary | ICD-10-CM | POA: Insufficient documentation

## 2011-11-01 DIAGNOSIS — I2510 Atherosclerotic heart disease of native coronary artery without angina pectoris: Secondary | ICD-10-CM

## 2011-11-01 DIAGNOSIS — E785 Hyperlipidemia, unspecified: Secondary | ICD-10-CM | POA: Insufficient documentation

## 2011-11-01 DIAGNOSIS — I10 Essential (primary) hypertension: Secondary | ICD-10-CM | POA: Insufficient documentation

## 2011-11-01 DIAGNOSIS — Z8249 Family history of ischemic heart disease and other diseases of the circulatory system: Secondary | ICD-10-CM | POA: Insufficient documentation

## 2011-11-01 DIAGNOSIS — R0989 Other specified symptoms and signs involving the circulatory and respiratory systems: Secondary | ICD-10-CM | POA: Insufficient documentation

## 2011-11-01 DIAGNOSIS — R079 Chest pain, unspecified: Secondary | ICD-10-CM

## 2011-11-01 DIAGNOSIS — R0609 Other forms of dyspnea: Secondary | ICD-10-CM | POA: Insufficient documentation

## 2011-11-01 DIAGNOSIS — I70219 Atherosclerosis of native arteries of extremities with intermittent claudication, unspecified extremity: Secondary | ICD-10-CM

## 2011-11-01 DIAGNOSIS — R0789 Other chest pain: Secondary | ICD-10-CM | POA: Insufficient documentation

## 2011-11-01 DIAGNOSIS — F17200 Nicotine dependence, unspecified, uncomplicated: Secondary | ICD-10-CM | POA: Insufficient documentation

## 2011-11-01 DIAGNOSIS — R9431 Abnormal electrocardiogram [ECG] [EKG]: Secondary | ICD-10-CM | POA: Insufficient documentation

## 2011-11-01 MED ORDER — TECHNETIUM TC 99M TETROFOSMIN IV KIT
11.0000 | PACK | Freq: Once | INTRAVENOUS | Status: AC | PRN
  Administered 2011-11-01: 11 via INTRAVENOUS

## 2011-11-01 MED ORDER — TECHNETIUM TC 99M TETROFOSMIN IV KIT
33.0000 | PACK | Freq: Once | INTRAVENOUS | Status: AC | PRN
  Administered 2011-11-01: 33 via INTRAVENOUS

## 2011-11-01 MED ORDER — REGADENOSON 0.4 MG/5ML IV SOLN
0.4000 mg | Freq: Once | INTRAVENOUS | Status: AC
  Administered 2011-11-01: 0.4 mg via INTRAVENOUS

## 2011-11-01 NOTE — Progress Notes (Signed)
[facility] SITE 3 NUCLEAR MED [address] [place] [address] [phone]  Cardiology Nuclear Med Study  David Pacheco is a 70 y.o. male [id number] 20-Jan-1941   Nuclear Med Background Indication for Stress Test:  Evaluation for Ischemia and Abnormal EKG History:  '09 Cath:N/O CAD, ? coronary spasm, EF=65%; '11 Echo:EF=55-60% Cardiac Risk Factors: Claudication, Family History - CAD, Hypertension, Lipids, PVD and Smoker  Symptoms:  Chest Tightness (last episode of chest discomfort was about 2-days ago), DOE and Palpitations   Nuclear Pre-Procedure Caffeine/Decaff Intake:  None NPO After: 7:30pm   Lungs:  Clear.  O2 SAT 98% on RA IV 0.9% NS with Angio Cath:  20g  IV Site: R Forearm x 1, tolerated well IV Started by:  Irean Hong, RN  Chest Size (in):  38 Cup Size: n/a  Height: 5\' 7"  (1.702 m)  Weight:  148 lb (67.132 kg)  BMI:  Body mass index is 23.18 kg/(m^2). Tech Comments:  No medications today, per patient.    Nuclear Med Study 1 or 2 day study: 1 day  Stress Test Type:  Lexiscan  [place] MD: Olga Millers, MD  Order Authorizing Provider:  Tonny Bollman, MD  Resting Radionuclide: Technetium 8m Tetrofosmin  Resting Radionuclide Dose: 11.0 mCi   Stress Radionuclide:  Technetium 66m Tetrofosmin  Stress Radionuclide Dose: 33.0 mCi           Stress Protocol Rest HR: 59 Stress HR: 86  Rest BP: Sitting:118/56  Standing:128/68 Stress BP: 151/61  Exercise Time (min): n/a METS: n/a   Predicted Max HR: 152 bpm % Max HR: 56.58 bpm Rate Pressure Product: 21308   Dose of Adenosine (mg):  n/a Dose of Lexiscan: 0.4 mg  Dose of Atropine (mg): n/a Dose of Dobutamine: n/a mcg/kg/min (at max HR)  Stress Test Technologist: Smiley Houseman, CMA-N  Nuclear Technologist:  Doyne Keel, CNMT     Rest Procedure:  Myocardial perfusion imaging was performed at rest 45 minutes following the intravenous administration of Technetium 65m Tetrofosmin.  Rest ECG:  Marked ST-T wave changes with occasional PAC's.  Stress Procedure:  The patient received IV Lexiscan 0.4 mg over 15-seconds.  Technetium 13m Tetrofosmin injected at 30-seconds.  There were no significant changes with Lexiscan, only occasional PAC's.  Quantitative spect images were obtained after a 45 minute delay.  Stress ECG: Uninteretable due to baseline changes.  QPS Raw Data Images:  Acquisition technically good; normal left ventricular size. Stress Images:  There is decreased uptake in the septum. Rest Images:  There is decreased uptake in the septum. Subtraction (SDS):  No evidence of ischemia. Transient Ischemic Dilatation (Normal <1.22):  1.14 Lung/Heart Ratio (Normal <0.45):  0.30  Quantitative Gated Spect Images QGS EDV:  106 ml QGS ESV:  48 ml QGS cine images:  NL LV Function; NL Wall Motion QGS EF: 54%  Impression Exercise Capacity:  Lexiscan with no exercise. BP Response:  Normal blood pressure response. Clinical Symptoms:  Chest tightness during infusion. ECG Impression: EKG uninterpretable due to baseline changes. Comparison with Prior Nuclear Study: No previous nuclear study performed  Overall Impression:  Probably normal stress nuclear study with a small fixed septal defect suggestive of thinning; no ischemia.   Olga Millers

## 2011-11-14 ENCOUNTER — Ambulatory Visit: Payer: Medicare Other | Admitting: Cardiovascular Disease

## 2012-02-16 ENCOUNTER — Other Ambulatory Visit: Payer: Self-pay | Admitting: Cardiovascular Disease

## 2012-09-23 ENCOUNTER — Other Ambulatory Visit: Payer: Self-pay | Admitting: Cardiovascular Disease

## 2012-10-07 ENCOUNTER — Other Ambulatory Visit: Payer: Self-pay | Admitting: Cardiovascular Disease

## 2012-10-08 ENCOUNTER — Other Ambulatory Visit: Payer: Self-pay | Admitting: Cardiovascular Disease

## 2012-10-23 ENCOUNTER — Other Ambulatory Visit: Payer: Self-pay | Admitting: Cardiovascular Disease

## 2012-11-07 ENCOUNTER — Other Ambulatory Visit: Payer: Self-pay | Admitting: Cardiovascular Disease

## 2012-11-21 ENCOUNTER — Other Ambulatory Visit: Payer: Self-pay | Admitting: Cardiovascular Disease

## 2012-12-06 ENCOUNTER — Other Ambulatory Visit: Payer: Self-pay | Admitting: Cardiovascular Disease

## 2012-12-23 ENCOUNTER — Other Ambulatory Visit: Payer: Self-pay | Admitting: Cardiovascular Disease

## 2013-01-06 ENCOUNTER — Other Ambulatory Visit: Payer: Self-pay | Admitting: Cardiovascular Disease

## 2013-01-07 ENCOUNTER — Other Ambulatory Visit: Payer: Self-pay | Admitting: Cardiovascular Disease

## 2013-01-08 ENCOUNTER — Other Ambulatory Visit: Payer: Self-pay | Admitting: Cardiovascular Disease

## 2013-01-22 ENCOUNTER — Other Ambulatory Visit: Payer: Self-pay | Admitting: Cardiovascular Disease

## 2013-02-05 ENCOUNTER — Other Ambulatory Visit: Payer: Self-pay | Admitting: Cardiovascular Disease

## 2013-02-06 ENCOUNTER — Other Ambulatory Visit: Payer: Self-pay | Admitting: Cardiovascular Disease

## 2013-02-20 ENCOUNTER — Other Ambulatory Visit: Payer: Self-pay | Admitting: Cardiovascular Disease

## 2013-03-07 ENCOUNTER — Other Ambulatory Visit: Payer: Self-pay | Admitting: Cardiovascular Disease

## 2013-05-07 ENCOUNTER — Other Ambulatory Visit: Payer: Self-pay | Admitting: Cardiovascular Disease

## 2013-05-07 NOTE — Telephone Encounter (Signed)
Patient need to make an appointment to see Cardiologist ASAP for further refills.......Marland KitchenNina,CMA

## 2013-06-06 ENCOUNTER — Other Ambulatory Visit: Payer: Self-pay | Admitting: Cardiovascular Disease

## 2013-07-09 ENCOUNTER — Other Ambulatory Visit: Payer: Self-pay | Admitting: Cardiovascular Disease

## 2013-09-05 ENCOUNTER — Other Ambulatory Visit: Payer: Self-pay | Admitting: Cardiovascular Disease

## 2013-09-05 NOTE — Telephone Encounter (Signed)
The pt has not been seen in our office since 2012.  I would deny these prescriptions and pt must make appt for further refills.

## 2013-09-05 NOTE — Telephone Encounter (Signed)
Is this ok to fill again? I saw that you put a note to pharmacy last time and he still didn't call to make an appointment. Please advise. Thank you, MI

## 2013-10-08 ENCOUNTER — Other Ambulatory Visit: Payer: Self-pay | Admitting: Cardiovascular Disease

## 2013-10-18 ENCOUNTER — Other Ambulatory Visit: Payer: Self-pay | Admitting: Cardiovascular Disease

## 2013-11-21 ENCOUNTER — Ambulatory Visit (INDEPENDENT_AMBULATORY_CARE_PROVIDER_SITE_OTHER): Payer: 59 | Admitting: Cardiovascular Disease

## 2013-11-21 ENCOUNTER — Encounter: Payer: Self-pay | Admitting: Cardiovascular Disease

## 2013-11-21 VITALS — BP 171/67 | HR 78 | Ht 67.0 in | Wt 161.2 lb

## 2013-11-21 DIAGNOSIS — E785 Hyperlipidemia, unspecified: Secondary | ICD-10-CM

## 2013-11-21 DIAGNOSIS — I10 Essential (primary) hypertension: Secondary | ICD-10-CM

## 2013-11-21 DIAGNOSIS — I2510 Atherosclerotic heart disease of native coronary artery without angina pectoris: Secondary | ICD-10-CM

## 2013-11-21 DIAGNOSIS — I739 Peripheral vascular disease, unspecified: Secondary | ICD-10-CM

## 2013-11-21 MED ORDER — AMLODIPINE BESYLATE 10 MG PO TABS: ORAL_TABLET | ORAL | Status: DC

## 2013-11-21 MED ORDER — PRAVASTATIN SODIUM 80 MG PO TABS: ORAL_TABLET | ORAL | Status: DC

## 2013-11-21 MED ORDER — LISINOPRIL 20 MG PO TABS: 20.0000 mg | ORAL_TABLET | Freq: Every day | ORAL | Status: DC

## 2013-11-21 NOTE — Patient Instructions (Addendum)
Your physician has requested that you have bilateral ankle brachial index (ABI) in [place]. During this test an ultrasound and blood pressure cuff are used to evaluate the arteries that supply the arms and legs with blood. Allow thirty minutes for this exam. There are no restrictions or special instructions.  Your physician has requested that you have a LEFT lower extremity arterial duplex in [place]. This test is an ultrasound of the arteries in the legs. It looks at arterial blood flow in the legs. Allow one hour for Lower Arterial scans. There are no restrictions or special instructions  Your physician has recommended you make the following change in your medication: INCREASE Lisinopril to 20mg  take one by mouth daily  Your physician wants you to follow-up in: 1 YEAR with Dr Excell Seltzer.  You will receive a reminder letter in the mail two months in advance. If you don't receive a letter, please call our office to schedule the follow-up appointment.

## 2013-11-23 NOTE — Progress Notes (Signed)
    HPI:  72 year-old male presenting for follow-up. He's followed for PAD and an abnormal EKG. He has a history of long total a left SFA occlusion and was treated with percutaneous stenting. The patient complains of left thigh and calf claudication with walking. This occurs at about 200 yards. He went for a quarter mild walk recently and had to stop 3-4 times.  He is limited by left calf pain and this resolves with rest. He describes this as an ache. He denies rest pain or ulceration on his feet. He has no right leg pain.  He denies chest pain or pressure, dyspnea, or edema. No palpitations, orthopnea, or PND. He continues to smoke cigarettes.  Outpatient Encounter Prescriptions as of 11/21/2013  Medication Sig  . amLODipine (NORVASC) 10 MG tablet TAKE 1 TABLET BY MOUTH EVERY DAY  . aspirin EC 325 MG tablet Take 81 mg by mouth daily.   . cholecalciferol (VITAMIN D) 1000 UNITS tablet Take 5,000 Units by mouth daily.   . isosorbide mononitrate (IMDUR) 30 MG 24 hr tablet TAKE 1 TABLET BY MOUTH DAILY  . lisinopril (PRINIVIL,ZESTRIL) 20 MG tablet Take 1 tablet (20 mg total) by mouth daily.  . pravastatin (PRAVACHOL) 80 MG tablet TAKE 1 TABLET BY MOUTH DAILY  . [DISCONTINUED] amLODipine (NORVASC) 10 MG tablet TAKE 1 TABLET BY MOUTH EVERY DAY  . [DISCONTINUED] lisinopril (PRINIVIL,ZESTRIL) 10 MG tablet Take 1 tablet (10 mg total) by mouth daily.  . [DISCONTINUED] pravastatin (PRAVACHOL) 80 MG tablet TAKE 1 TABLET BY MOUTH DAILY    No Known Allergies  Past Medical History  Diagnosis Date  . CAD (coronary artery disease)     nonobstructive  . PVD (peripheral vascular disease)     lower extremity PAD eith intermittent claudication  . Essential hypertension   . Dyslipidemia     ROS: Negative except as per HPI  BP 171/67  Pulse 78  Ht 5\' 7"  (1.702 m)  Wt 161 lb 3.2 oz (73.12 kg)  BMI 25.24 kg/m2  PHYSICAL EXAM: Pt is alert and oriented, NAD HEENT: normal Neck: JVP - normal, carotids  2+= without bruits Lungs: CTA bilaterally CV: RRR without murmur or gallop Abd: soft, NT, Positive BS, no hepatomegaly Ext: no C/C/E Right fem 2+, DP 2+, PT 2+ Left fem 1+, DP 1+, PT 1+ Skin: warm/dry no rash  EKG:  Sinus rhythm with PAC's. Marked ST-T wave abnormality, unchanged from previous tracing.  ASSESSMENT AND PLAN: 1. PAD with lifestyle limiting claudication - overall stable but limiting symptoms. 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 Check ABI's and left lower extremity duplex to reassess anatomy. Continue ASA, statin, ACE-inhibitor. Consider revascularization if symptoms progress or if high-risk features on duplex exam.  2. Abnormal EKG: marked t wave abnormality but hx nonobstructive CAD at cath and normal Myoview last year.  3. HTN, suboptimal control. Increase lisinopril to 20 mg daily.  4. Tobacco use: cessation counseling done.  Will follow-up in one year unless major changes seen on duplex/ABI's.  Tonny Bollman 11/23/2013 9:29 PM

## 2013-12-04 ENCOUNTER — Encounter (INDEPENDENT_AMBULATORY_CARE_PROVIDER_SITE_OTHER): Payer: 59

## 2013-12-04 DIAGNOSIS — E785 Hyperlipidemia, unspecified: Secondary | ICD-10-CM

## 2013-12-04 DIAGNOSIS — I70219 Atherosclerosis of native arteries of extremities with intermittent claudication, unspecified extremity: Secondary | ICD-10-CM

## 2013-12-04 DIAGNOSIS — I739 Peripheral vascular disease, unspecified: Secondary | ICD-10-CM

## 2013-12-04 DIAGNOSIS — I10 Essential (primary) hypertension: Secondary | ICD-10-CM

## 2013-12-04 DIAGNOSIS — I2510 Atherosclerotic heart disease of native coronary artery without angina pectoris: Secondary | ICD-10-CM

## 2013-12-13 ENCOUNTER — Telehealth: Payer: Self-pay | Admitting: Cardiovascular Disease

## 2013-12-13 NOTE — Telephone Encounter (Signed)
New message ° ° ° ° °Want test results °

## 2013-12-18 NOTE — Telephone Encounter (Signed)
Reviewed findings with the patient. Please schedule PV angiogram with possible PTA and stenting next Wednesday with Dr. Kirke CorinArida. I have discussed with him as well.

## 2013-12-19 ENCOUNTER — Telehealth: Payer: Self-pay | Admitting: Nurse Practitioner

## 2013-12-19 ENCOUNTER — Encounter (HOSPITAL_COMMUNITY): Payer: Self-pay | Admitting: Pharmacy Technician

## 2013-12-19 ENCOUNTER — Encounter: Payer: Self-pay | Admitting: Nurse Practitioner

## 2013-12-19 ENCOUNTER — Encounter: Payer: Self-pay | Admitting: *Deleted

## 2013-12-19 MED ORDER — CLOPIDOGREL BISULFATE 75 MG PO TABS: 75.0000 mg | ORAL_TABLET | Freq: Every day | ORAL | Status: DC

## 2013-12-19 NOTE — Telephone Encounter (Signed)
Fax confirmation received.  Orders with fax number placed on Dr. Earmon Phoenixooper's cart.  Please refax to [facility] if patient encounters any problem getting labs on Monday.

## 2013-12-19 NOTE — Telephone Encounter (Signed)
Spoke with patient and scheduled him for PV Angiogram with possible PTA and stenting with Dr. Kirke CorinArida Wed. 1/21 at 0830.  Patient is aware to [facility] on the day of procedure.  I reviewed all instructions with patient by telephone and placed a copy in the mail.  I sent Rx for Plavix 75 mg QD to be started tomorrow to patient's pharmacy.  Patient requests lab work done in the [place] area since he lives in [place].  I faxed orders for pre-procedure labs (CBC w/diff, BMET, PT/INR) to [facility] at [phone] per lab and registration personnel instruction.  Patient verbalized understanding and agreement with instructions.

## 2013-12-23 ENCOUNTER — Encounter: Payer: Self-pay | Admitting: Cardiovascular Disease

## 2013-12-24 ENCOUNTER — Other Ambulatory Visit: Payer: Self-pay | Admitting: Cardiovascular Disease

## 2013-12-24 DIAGNOSIS — I739 Peripheral vascular disease, unspecified: Secondary | ICD-10-CM

## 2013-12-24 NOTE — Telephone Encounter (Signed)
[facility]

## 2013-12-25 ENCOUNTER — Other Ambulatory Visit: Payer: Self-pay | Admitting: Nurse Practitioner

## 2013-12-25 ENCOUNTER — Ambulatory Visit (HOSPITAL_COMMUNITY)
Admission: RE | Admit: 2013-12-25 | Discharge: 2013-12-25 | Disposition: A | Discharge status: Home / Self Care | Payer: PRIVATE HEALTH INSURANCE | Source: Ambulatory Visit | Attending: Cardiovascular Disease | Admitting: Cardiovascular Disease

## 2013-12-25 ENCOUNTER — Encounter (HOSPITAL_COMMUNITY)
Admission: RE | Disposition: A | Discharge status: Home / Self Care | Payer: Self-pay | Source: Ambulatory Visit | Attending: Cardiovascular Disease

## 2013-12-25 DIAGNOSIS — I2510 Atherosclerotic heart disease of native coronary artery without angina pectoris: Secondary | ICD-10-CM | POA: Insufficient documentation

## 2013-12-25 DIAGNOSIS — F17200 Nicotine dependence, unspecified, uncomplicated: Secondary | ICD-10-CM | POA: Insufficient documentation

## 2013-12-25 DIAGNOSIS — I739 Peripheral vascular disease, unspecified: Secondary | ICD-10-CM

## 2013-12-25 DIAGNOSIS — T82898A Other specified complication of vascular prosthetic devices, implants and grafts, initial encounter: Secondary | ICD-10-CM | POA: Insufficient documentation

## 2013-12-25 DIAGNOSIS — I10 Essential (primary) hypertension: Secondary | ICD-10-CM | POA: Insufficient documentation

## 2013-12-25 DIAGNOSIS — E785 Hyperlipidemia, unspecified: Secondary | ICD-10-CM | POA: Insufficient documentation

## 2013-12-25 DIAGNOSIS — I70219 Atherosclerosis of native arteries of extremities with intermittent claudication, unspecified extremity: Secondary | ICD-10-CM

## 2013-12-25 DIAGNOSIS — Z7982 Long term (current) use of aspirin: Secondary | ICD-10-CM | POA: Insufficient documentation

## 2013-12-25 DIAGNOSIS — Y831 Surgical operation with implant of artificial internal device as the cause of abnormal reaction of the patient, or of later complication, without mention of misadventure at the time of the procedure: Secondary | ICD-10-CM | POA: Insufficient documentation

## 2013-12-25 HISTORY — PX: LOWER EXTREMITY ANGIOGRAM: SHX5508

## 2013-12-25 HISTORY — PX: IR FEM POP INC ATHEREC / STENT / PTA MOD SED: IMG2312

## 2013-12-25 LAB — CBC
HCT: 36.2 % — ABNORMAL LOW (ref 39.0–52.0)
HEMATOCRIT: 38.2 % — AB (ref 39.0–52.0)
Hemoglobin: 13 g/dL (ref 13.0–17.0)
Hemoglobin: 12.5 g/dL — ABNORMAL LOW (ref 13.0–17.0)
MCH: 32.7 pg (ref 26.0–34.0)
MCH: 33.2 pg (ref 26.0–34.0)
MCHC: 34 g/dL (ref 30.0–36.0)
MCHC: 34.5 g/dL (ref 30.0–36.0)
MCV: 96 fL (ref 78.0–100.0)
MCV: 96 fL (ref 78.0–100.0)
PLATELETS: 341 10*3/uL (ref 150–400)
Platelets: 315 10*3/uL (ref 150–400)
RBC: 3.98 MIL/uL — AB (ref 4.22–5.81)
RBC: 3.77 MIL/uL — AB (ref 4.22–5.81)
RDW: 13.5 % (ref 11.5–15.5)
RDW: 13.7 % (ref 11.5–15.5)
WBC: 7.6 10*3/uL (ref 4.0–10.5)
WBC: 8.9 10*3/uL (ref 4.0–10.5)

## 2013-12-25 LAB — PROTIME-INR
INR: 1.02 (ref 0.00–1.49)
PROTHROMBIN TIME: 13.2 s (ref 11.6–15.2)

## 2013-12-25 SURGERY — ANGIOGRAM, LOWER EXTREMITY
Anesthesia: LOCAL

## 2013-12-25 MED ORDER — STUDY - INVESTIGATIONAL DRUG SIMPLE RECORD (ML): 6.0000 mL | Freq: Once | Status: DC

## 2013-12-25 MED ORDER — HEPARIN (PORCINE) IN NACL 2-0.9 UNIT/ML-% IJ SOLN
INTRAMUSCULAR | Status: AC
  Filled 2013-12-25: qty 500

## 2013-12-25 MED ORDER — MIDAZOLAM HCL 2 MG/2ML IJ SOLN
INTRAMUSCULAR | Status: AC
  Filled 2013-12-25: qty 2

## 2013-12-25 MED ORDER — SODIUM CHLORIDE 0.9 % IV SOLN
INTRAVENOUS | Status: DC
  Administered 2013-12-25: 1000 mL via INTRAVENOUS

## 2013-12-25 MED ORDER — SODIUM CHLORIDE 0.9 % IJ SOLN: 3.0000 mL | Freq: Two times a day (BID) | INTRAMUSCULAR | Status: DC

## 2013-12-25 MED ORDER — SODIUM CHLORIDE 0.9 % IJ SOLN: 3.0000 mL | INTRAMUSCULAR | Status: DC | PRN

## 2013-12-25 MED ORDER — ACETAMINOPHEN 325 MG PO TABS: 650.0000 mg | ORAL_TABLET | ORAL | Status: DC | PRN

## 2013-12-25 MED ORDER — HEPARIN (PORCINE) IN NACL 2-0.9 UNIT/ML-% IJ SOLN
INTRAMUSCULAR | Status: AC
Start: 2013-12-25 — End: 2013-12-25
  Filled 2013-12-25: qty 500

## 2013-12-25 MED ORDER — LIDOCAINE HCL (PF) 1 % IJ SOLN
INTRAMUSCULAR | Status: AC
  Filled 2013-12-25: qty 30

## 2013-12-25 MED ORDER — SODIUM CHLORIDE 0.9 % IV SOLN: INTRAVENOUS | Status: DC

## 2013-12-25 MED ORDER — FENTANYL CITRATE 0.05 MG/ML IJ SOLN
INTRAMUSCULAR | Status: AC
Start: 2013-12-25 — End: 2013-12-25
  Filled 2013-12-25: qty 2

## 2013-12-25 MED ORDER — SODIUM CHLORIDE 0.9 % IV SOLN: 250.0000 mL | INTRAVENOUS | Status: DC | PRN

## 2013-12-25 MED ORDER — ASPIRIN 81 MG PO CHEW
81.0000 mg | CHEWABLE_TABLET | ORAL | Status: AC
  Administered 2013-12-25: 81 mg via ORAL
  Filled 2013-12-25: qty 1

## 2013-12-25 MED ORDER — STUDY - INVESTIGATIONAL DRUG SIMPLE RECORD (ML): 50.0000 mL | Freq: Once | Status: DC

## 2013-12-25 NOTE — H&P (Signed)
    HPI: 73 year-old male referred by Dr. Excell Seltzerooper for LE angio and possible intervention for L SFA ISR. He's followed for PAD and an abnormal EKG. He has a history of long total a left SFA occlusion and was treated with percutaneous stenting in 2010. The patient complains of left thigh and calf claudication with walking. This occurs at about 200 yards. He went for a quarter mild walk recently and had to stop 3-4 times. He is limited by left calf pain and this resolves with rest. He describes this as an ache. He denies rest pain or ulceration on his feet. He has no right leg pain.  He denies chest pain or pressure, dyspnea, or edema. No palpitations, orthopnea, or PND. He continues to smoke cigarettes.  Recnt Duplex showed severe instent restenosis.   Outpatient Encounter Prescriptions as of 11/21/2013   Medication  Sig   .  amLODipine (NORVASC) 10 MG tablet  TAKE 1 TABLET BY MOUTH EVERY DAY   .  aspirin EC 325 MG tablet  Take 81 mg by mouth daily.   .  cholecalciferol (VITAMIN D) 1000 UNITS tablet  Take 5,000 Units by mouth daily.   .  isosorbide mononitrate (IMDUR) 30 MG 24 hr tablet  TAKE 1 TABLET BY MOUTH DAILY   .  lisinopril (PRINIVIL,ZESTRIL) 20 MG tablet  Take 1 tablet (20 mg total) by mouth daily.   .  pravastatin (PRAVACHOL) 80 MG tablet  TAKE 1 TABLET BY MOUTH DAILY   .  [DISCONTINUED] amLODipine (NORVASC) 10 MG tablet  TAKE 1 TABLET BY MOUTH EVERY DAY   .  [DISCONTINUED] lisinopril (PRINIVIL,ZESTRIL) 10 MG tablet  Take 1 tablet (10 mg total) by mouth daily.   .  [DISCONTINUED] pravastatin (PRAVACHOL) 80 MG tablet  TAKE 1 TABLET BY MOUTH DAILY   No Known Allergies  Past Medical History   Diagnosis  Date   .  CAD (coronary artery disease)      nonobstructive   .  PVD (peripheral vascular disease)      lower extremity PAD eith intermittent claudication   .  Essential hypertension    .  Dyslipidemia    ROS: Negative except as per HPI  BP 171/67  Pulse 78  Ht 5\' 7"  (1.702 m)  Wt  161 lb 3.2 oz (73.12 kg)  BMI 25.24 kg/m2  PHYSICAL EXAM:  Pt is alert and oriented, NAD  HEENT: normal  Neck: JVP - normal, carotids 2+= without bruits  Lungs: CTA bilaterally  CV: RRR without murmur or gallop  Abd: soft, NT, Positive BS, no hepatomegaly  Ext: no C/C/E  Right fem 2+, DP 2+, PT 2+  Left fem 1+, DP 1+, PT 1+  Skin: warm/dry no rash  EKG: Sinus rhythm with PAC's. Marked ST-T wave abnormality, unchanged from previous tracing.   ASSESSMENT AND PLAN:  1. PAD with severe lifestyle limiting claudication - Mildly reduced ABI with evidence of severe L SFA in stent restenosis.  Plan angiography today and possible intervention.   2. Abnormal EKG: marked t wave abnormality but hx nonobstructive CAD at cath and normal Myoview last year.  3. HTN, suboptimal control. Increase lisinopril to 20 mg daily.  4. Tobacco use: cessation counseling done.   Lorine BearsMuhammad Yanin Muhlestein, MD

## 2013-12-25 NOTE — CV Procedure (Signed)
PERIPHERAL VASCULAR PROCEDURE  NAME:  David Pacheco   MRN: [id number] DOB:  1941/07/05   ADMIT DATE: 12/25/2013  Performing Cardiologist: Lorine BearsMuhammad Amory Simonetti Primary Physician: Kathlee NationsEASON,PAUL, MD Primary Cardiologist:  Dr. Excell Seltzerooper  Procedures Performed:  Abdominal Aortic Angiogram.  Bilateral iliac angiography.  Selective left lower extremity arterial angiography  Directional atherectomy of left SFA  Balloon angioplasty and drug-coated balloon angioplasty of the left SFA  Balloon angioplasty of the ostial left profunda artery in a kissing fashion with the left SFA  Mynx closure device.    Indication(s):   Claudication   Consent: The procedure with Risks/Benefits/Alternatives and Indications was reviewed with the patient .  All questions were answered.  Medications:  Sedation:  2 mg IV Versed, 75 mcg IV Fentanyl  Contrast:  118 ml  Visipaque   Procedural details: The right groin was prepped, draped, and anesthetized with 1% lidocaine. Using modified Seldinger technique, a 5 French sheath was introduced into the right common femoral artery. A 5 Fr Short Pigtail Catheter was advanced of over a  Versicore wire into the descending Aorta to a level just above the renal arteries. A power injection of 6720ml/sec contrast over 1 sec was performed for Abdominal Aortic Angiography.  The catheter was then pulled back to a level just above the Aortic bifurcation, and a second power injection was performed to evaluate the iliac arteries.   The pigtail catheter was changed over the Versicore wire for A crossover catheter which was then pulled back the aortic bifurcation and the wire was advanced down the contralateral common iliac artery.  The wire was then advanced to the contralateral common femoral artery, the catheter was exchanged into an end hole straight tip catheter which was advanced over the wire to the common femoral artery. Contralateral second-order lower extremity angiography was  performed via power injection of 5 ml / sec contrast for a total of 35 ml.       Interventional Procedure:  The patient was enrolled in the EndoMax study. The sheath was exchanged into a 7 French 45 cm destination sheath. The tip was placed in the left common femoral artery. Once a therapeutic ACT was achieved, the lesion was crossed with the Versicore wire. A straight endhole catheter was advanced through the stenosis into the proximal left popliteal artery. The wire was removed. A 6 mm spider distal protection device was then advanced through the catheter and placed in the proximal left popliteal artery. Directional atherectomy was performed with a turboHawk with multiple runs. Angiography showed significantly improved appearance. I then used a 6 x 200 millimeter balloon and performed angioplasty to the whole stented area. I elected to use coated balloon into area with the most restenosis. I used the Lutonix 6 x 100 mm balloon which was inflated for 2 minutes. The left profunda was jailed already by the previously placed stent. There was significant 95% ostial stenosis in both the superior and inferior branches. I decided to perform balloon angioplasty in the inferior branch of the profunda in order to maintain patency of this vessel in the future. The lesion was wired with run through wire. I then used a 3 mm balloon and a profunda and a 4 mm balloon in the SFA and perform kissing balloon angioplasty. Angiography showed significantly improved appearance of the profunda. Final angiography showed good results with no evidence of distal embolization. The spider device was retrieved. Hemostasis was achieved with a  Mynx closure device. The patient tolerated the procedure well  with no immediate complications.   Hemodynamics:  Central Aortic Pressure / Mean Aortic Pressure: 122/57  Findings:  Abdominal aorta: Normal in size with no evidence of aneurysm.  Left renal artery: Normal  Right renal artery:  Minor irregularities.  Celiac artery: Not well visualized.  Superior mesenteric artery: Not well visualized.  Right common iliac artery: Minor irregularities.  Right internal iliac artery: Minor irregularities.  Right external iliac artery: 20% tubular stenosis.  Right common femoral artery: Minor irregularities.  Left common iliac artery:  Minor irregularities.  Left internal iliac artery: Mild proximal disease.  Left external iliac artery: Minor irregularities.  Left common femoral artery: 50% proximal stenosis  Left profunda femoral artery: Normal in size with 95% ostial stenosis in both branches. The inferior branch is jailed by the SFA stent.  Left superficial femoral artery:  2 overlapped stents are noted in the proximal and midsegment. There is diffuse 50-60% restenosis throughout the stented area with a 90% restenosis in the midsegment.  Left popliteal artery: Normal  Three-vessel runoff below the knee with the most dominant being the posterior tibial.   Conclusions: 1. No significant aortoiliac disease. 2. Moderate left common femoral artery stenosis. 3. 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 Successful directional atherectomy and not coated balloon angioplasty to the left SFA. 5. Successful kissing balloon angioplasty of the ostial profunda and SFA  Recommendations:  Continue dual antiplatelet therapy for at least one month. Smoking cessation is strongly advised.   Lorine Bears, MD, [facility] 12/25/2013 10:50 AM

## 2013-12-25 NOTE — Research (Signed)
Endomax Informed Consent   Subject Name: David Pacheco  Subject met inclusion and exclusion criteria.  The informed consent form, study requirements and expectations were reviewed with the subject and questions and concerns were addressed prior to the signing of the consent form.  The subject verbalized understanding of the trail requirements.  The subject agreed to participate in the Endomax trial and signed the informed consent.  The informed consent was obtained prior to performance of any protocol-specific procedures for the subject.  A copy of the signed informed consent was given to the subject and a copy was placed in the subject's medical record.  [name] 12/25/2013, 7:25

## 2013-12-25 NOTE — Discharge Instructions (Signed)
Angiography, Care After °Refer to this sheet in the next few weeks. These instructions provide you with information on caring for yourself after your procedure. Your health care provider may also give you more specific instructions. Your treatment has been planned according to current medical practices, but problems sometimes occur. Call your health care provider if you have any problems or questions after your procedure.  °WHAT TO EXPECT AFTER THE PROCEDURE °After your procedure, it is typical to have the following sensations: °· Minor discomfort or tenderness and a small bump at the catheter insertion site. The bump should usually decrease in size and tenderness within 1 to 2 weeks. °· Any bruising will usually fade within 2 to 4 weeks. °HOME CARE INSTRUCTIONS  °· You may need to keep taking blood thinners if they were prescribed for you. Only take over-the-counter or prescription medicines for pain, fever, or discomfort as directed by your health care provider. °· Do not apply powder or lotion to the site. °· Do not sit in a bathtub, swimming pool, or whirlpool for 5 to 7 days. °· You may shower 24 hours after the procedure. Remove the bandage (dressing) and gently wash the site with plain soap and water. Gently pat the site dry. °· Inspect the site at least twice daily. °· Limit your activity for the first 48 hours. Do not bend, squat, or lift anything over 20 lb (9 kg) or as directed by your health care provider. °· Do not drive home if you are discharged the day of the procedure. Have someone else drive you. Follow instructions about when you can drive or return to work. °SEEK MEDICAL CARE IF: °· You get lightheaded when standing up. °· You have drainage (other than a small amount of blood on the dressing). °· You have chills. °· You have a fever. °· You have redness, warmth, swelling, or pain at the insertion site. °SEEK IMMEDIATE MEDICAL CARE IF:  °· You develop chest pain or shortness of breath, feel faint,  or pass out. °· You have bleeding, swelling larger than a walnut, or drainage from the catheter insertion site. °· You develop pain, discoloration, coldness, or severe bruising in the leg or arm that held the catheter. °· You develop bleeding from any other place, such as the bowels. You may see bright red blood in your urine or stools, or your stools may appear black and tarry. °· You have heavy bleeding from the site. If this happens, hold pressure on the site. °MAKE SURE YOU: °· Understand these instructions. °· Will watch your condition. °· Will get help right away if you are not doing well or get worse. °Document Released: 06/09/2005 Document Revised: 07/24/2013 Document Reviewed: 04/15/2013 °ExitCare® Patient Information ©2014 [employer]. ° °

## 2014-01-02 ENCOUNTER — Encounter (INDEPENDENT_AMBULATORY_CARE_PROVIDER_SITE_OTHER): Payer: PRIVATE HEALTH INSURANCE

## 2014-01-02 DIAGNOSIS — I739 Peripheral vascular disease, unspecified: Secondary | ICD-10-CM

## 2014-01-21 ENCOUNTER — Encounter: Payer: PRIVATE HEALTH INSURANCE | Admitting: Cardiovascular Disease

## 2014-02-02 ENCOUNTER — Other Ambulatory Visit: Payer: Self-pay | Admitting: Cardiovascular Disease

## 2014-02-04 ENCOUNTER — Other Ambulatory Visit: Payer: Self-pay | Admitting: Cardiovascular Disease

## 2014-02-11 ENCOUNTER — Encounter: Payer: PRIVATE HEALTH INSURANCE | Admitting: Cardiovascular Disease

## 2014-03-18 ENCOUNTER — Encounter: Payer: Self-pay | Admitting: Cardiovascular Disease

## 2014-03-18 ENCOUNTER — Other Ambulatory Visit: Payer: Self-pay | Admitting: Cardiology

## 2014-03-18 ENCOUNTER — Ambulatory Visit (INDEPENDENT_AMBULATORY_CARE_PROVIDER_SITE_OTHER): Payer: PRIVATE HEALTH INSURANCE | Admitting: Cardiovascular Disease

## 2014-03-18 VITALS — BP 137/54 | HR 52 | Ht 67.0 in | Wt 155.0 lb

## 2014-03-18 DIAGNOSIS — I739 Peripheral vascular disease, unspecified: Secondary | ICD-10-CM

## 2014-03-18 DIAGNOSIS — F17200 Nicotine dependence, unspecified, uncomplicated: Secondary | ICD-10-CM

## 2014-03-18 DIAGNOSIS — Z720 Tobacco use: Secondary | ICD-10-CM

## 2014-03-18 MED ORDER — NICOTINE 7 MG/24HR TD PT24: 7.0000 mg | MEDICATED_PATCH | Freq: Every day | TRANSDERMAL | Status: DC

## 2014-03-18 MED ORDER — NICOTINE 21 MG/24HR TD PT24: 21.0000 mg | MEDICATED_PATCH | Freq: Every day | TRANSDERMAL | Status: DC

## 2014-03-18 MED ORDER — NICOTINE 14 MG/24HR TD PT24: 14.0000 mg | MEDICATED_PATCH | Freq: Every day | TRANSDERMAL | Status: DC

## 2014-03-18 NOTE — Progress Notes (Deleted)
    HPI:  73 year-old male presenting for follow-up. He's followed for PAD and an abnormal EKG. He has a history of long total a left SFA occlusion and was treated with percutaneous stenting. The patient complains of left thigh and calf claudication with walking. This occurs at about 200 yards. He went for a quarter mild walk recently and had to stop 3-4 times.  He is limited by left calf pain and this resolves with rest. He describes this as an ache. He denies rest pain or ulceration on his feet. He has no right leg pain.  He denies chest pain or pressure, dyspnea, or edema. No palpitations, orthopnea, or PND. He continues to smoke cigarettes.  Outpatient Encounter Prescriptions as of 03/18/2014  Medication Sig  . amLODipine (NORVASC) 10 MG tablet Take 10 mg by mouth daily.  Marland Kitchen. aspirin EC 81 MG tablet Take 81 mg by mouth daily.  . cholecalciferol (VITAMIN D) 1000 UNITS tablet Take 5,000 Units by mouth daily.   . clopidogrel (PLAVIX) 75 MG tablet Take 1 tablet (75 mg total) by mouth daily.  . isosorbide mononitrate (IMDUR) 30 MG 24 hr tablet TAKE 1 TABLET BY MOUTH DAILY  . lisinopril (PRINIVIL,ZESTRIL) 20 MG tablet Take 1 tablet (20 mg total) by mouth daily.  . Multiple Vitamin (MULTIVITAMIN WITH MINERALS) TABS tablet Take 1 tablet by mouth daily.  . pravastatin (PRAVACHOL) 80 MG tablet Take 80 mg by mouth daily.  . [DISCONTINUED] amLODipine (NORVASC) 10 MG tablet TAKE 1 TABLET BY MOUTH DAILY  . [DISCONTINUED] isosorbide mononitrate (IMDUR) 30 MG 24 hr tablet Take 30 mg by mouth daily.    No Known Allergies  Past Medical History  Diagnosis Date  . CAD (coronary artery disease)     nonobstructive  . PVD (peripheral vascular disease)     lower extremity PAD eith intermittent claudication  . Essential hypertension   . Dyslipidemia     ROS: Negative except as per HPI  BP 137/54  Pulse 52  Ht 5\' 7"  (1.702 m)  Wt 155 lb (70.308 kg)  BMI 24.27 kg/m2  PHYSICAL EXAM: Pt is alert and  oriented, NAD HEENT: normal Neck: JVP - normal, carotids 2+= without bruits Lungs: CTA bilaterally CV: RRR without murmur or gallop Abd: soft, NT, Positive BS, no hepatomegaly Ext: no C/C/E Right fem 2+, DP 2+, PT 2+ Left fem 1+, DP 1+, PT 1+ Skin: warm/dry no rash  EKG:  Sinus rhythm with PAC's. Marked ST-T wave abnormality, unchanged from previous tracing.  ASSESSMENT AND PLAN: 1. PAD with lifestyle limiting claudication - overall stable but limiting symptoms. 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 Check ABI's and left lower extremity duplex to reassess anatomy. Continue ASA, statin, ACE-inhibitor. Consider revascularization if symptoms progress or if high-risk features on duplex exam.  2. Abnormal EKG: marked t wave abnormality but hx nonobstructive CAD at cath and normal Myoview last year.  3. HTN, suboptimal control. Increase lisinopril to 20 mg daily.  4. Tobacco use: cessation counseling done.  Will follow-up in one year unless major changes seen on duplex/ABI's.  Iran OuchMuhammad A Lamekia Nolden 03/18/2014 9:47 AM

## 2014-03-18 NOTE — Progress Notes (Signed)
     HPI:  73 year-old male presenting for follow-up. He's followed for PAD . He has a history of long total a left SFA occlusion and was treated with percutaneous stenting in 2010. He was seen in this year for worsening lifestyle limiting left thigh and calf claudication.  There was evidence of significant in-stent restenosis in the left SFA  . I proceeded with angiography in January which showed moderate left common femoral artery stenosis. There was evidence of severe in-stent restenosis in the left SFA with severe ostial stenosis in the left profunda artery which was likely the cause of his thigh claudication. I proceeded with directional atherectomy and drug coated balloon angioplasty of the left SFA with kissing balloon angioplasty of the left profunda and ostial SFA with excellent results. He reports resolution of claudication. Post procedure ABI was normal. He is doing very well but continues to smoke at least half a pack per day.  Outpatient Encounter Prescriptions as of 03/18/2014  Medication Sig  . amLODipine (NORVASC) 10 MG tablet Take 10 mg by mouth daily.  Marland Kitchen. aspirin EC 81 MG tablet Take 81 mg by mouth daily.  . cholecalciferol (VITAMIN D) 1000 UNITS tablet Take 5,000 Units by mouth daily.   . clopidogrel (PLAVIX) 75 MG tablet Take 1 tablet (75 mg total) by mouth daily.  . isosorbide mononitrate (IMDUR) 30 MG 24 hr tablet TAKE 1 TABLET BY MOUTH DAILY  . lisinopril (PRINIVIL,ZESTRIL) 20 MG tablet Take 1 tablet (20 mg total) by mouth daily.  . Multiple Vitamin (MULTIVITAMIN WITH MINERALS) TABS tablet Take 1 tablet by mouth daily.  . pravastatin (PRAVACHOL) 80 MG tablet Take 80 mg by mouth daily.  . [DISCONTINUED] amLODipine (NORVASC) 10 MG tablet TAKE 1 TABLET BY MOUTH DAILY  . [DISCONTINUED] isosorbide mononitrate (IMDUR) 30 MG 24 hr tablet Take 30 mg by mouth daily.    No Known Allergies  Past Medical History  Diagnosis Date  . CAD (coronary artery disease)     nonobstructive    . PVD (peripheral vascular disease)     lower extremity PAD eith intermittent claudication  . Essential hypertension   . Dyslipidemia     ROS: Negative except as per HPI  BP 137/54  Pulse 52  Ht 5\' 7"  (1.702 m)  Wt 155 lb (70.308 kg)  BMI 24.27 kg/m2  PHYSICAL EXAM: Pt is alert and oriented, NAD HEENT: normal Neck: JVP - normal, carotids 2+= without bruits Lungs: CTA bilaterally CV: RRR without murmur or gallop Abd: soft, NT, Positive BS, no hepatomegaly Ext: no C/C/E Right fem 2+, DP 2+, PT 2+ Left fem 2+, DP 2+, PT 2+ Skin: warm/dry no rash    ASSESSMENT AND PLAN:  1. PAD : He is doing very well after revascularization in January. Distal pulses are normal . Continue ASA, statin, ACE-inhibitor. I favor continuing treatment with Plavix for now as long as tolerated. Followup lower extremity arterial duplex in June.  2. HTN: Blood pressure is reasonably controlled on current medications.  3. Tobacco use: I had an extensive discussion with him about the importance of smoking cessation in order to decrease the chances of restenosis. I presented different options for him and he agreed to start using a nicotine patch. He is determined to quit.   Iran OuchMuhammad A Kathelene Rumberger 03/18/2014 9:42 AM

## 2014-03-18 NOTE — Patient Instructions (Signed)
Your physician has requested that you have a lower extremity arterial duplex in June [facility]([place] office). This test is an ultrasound of the arteries in the legs. It looks at arterial blood flow in the legs. Allow one hour for Lower Arterial scans. There are no restrictions or special instructions  Your physician wants you to follow-up in: 6 MONTHS with Dr Kirke CorinArida. You will receive a reminder letter in the mail two months in advance. If you don't receive a letter, please call our office to schedule the follow-up appointment.  Prescription given to the pt for nicotine patches.

## 2014-05-29 ENCOUNTER — Other Ambulatory Visit (HOSPITAL_COMMUNITY): Payer: Self-pay

## 2014-05-29 ENCOUNTER — Other Ambulatory Visit (HOSPITAL_COMMUNITY): Payer: Self-pay | Admitting: Radiology

## 2014-05-29 ENCOUNTER — Encounter (INDEPENDENT_AMBULATORY_CARE_PROVIDER_SITE_OTHER): Payer: PRIVATE HEALTH INSURANCE

## 2014-05-29 DIAGNOSIS — I70219 Atherosclerosis of native arteries of extremities with intermittent claudication, unspecified extremity: Secondary | ICD-10-CM

## 2014-05-29 DIAGNOSIS — I739 Peripheral vascular disease, unspecified: Secondary | ICD-10-CM

## 2014-07-26 ENCOUNTER — Other Ambulatory Visit: Payer: Self-pay | Admitting: Cardiovascular Disease

## 2014-11-06 ENCOUNTER — Other Ambulatory Visit: Payer: Self-pay | Admitting: Cardiovascular Disease

## 2014-11-13 ENCOUNTER — Encounter (HOSPITAL_COMMUNITY): Payer: Self-pay | Admitting: Cardiovascular Disease

## 2014-11-22 ENCOUNTER — Other Ambulatory Visit: Payer: Self-pay | Admitting: Cardiovascular Disease

## 2014-12-07 ENCOUNTER — Other Ambulatory Visit: Payer: Self-pay | Admitting: Cardiovascular Disease

## 2015-01-05 ENCOUNTER — Other Ambulatory Visit: Payer: Self-pay | Admitting: Cardiovascular Disease

## 2015-02-05 ENCOUNTER — Other Ambulatory Visit: Payer: Self-pay | Admitting: Cardiovascular Disease

## 2015-02-25 ENCOUNTER — Telehealth: Payer: Self-pay

## 2015-02-25 NOTE — Telephone Encounter (Signed)
Unable to reach patient by telephone. He needs 1 yr cartoid doppler fu per Vascular Lab report Mailed a reminder to patient.

## 2015-03-03 ENCOUNTER — Ambulatory Visit (INDEPENDENT_AMBULATORY_CARE_PROVIDER_SITE_OTHER): Payer: Medicare Other | Admitting: Cardiovascular Disease

## 2015-03-03 ENCOUNTER — Encounter: Payer: Self-pay | Admitting: Cardiovascular Disease

## 2015-03-03 VITALS — BP 116/60 | HR 65 | Ht 67.0 in | Wt 151.8 lb

## 2015-03-03 DIAGNOSIS — R9431 Abnormal electrocardiogram [ECG] [EKG]: Secondary | ICD-10-CM | POA: Diagnosis not present

## 2015-03-03 DIAGNOSIS — I2510 Atherosclerotic heart disease of native coronary artery without angina pectoris: Secondary | ICD-10-CM | POA: Diagnosis not present

## 2015-03-03 DIAGNOSIS — I10 Essential (primary) hypertension: Secondary | ICD-10-CM | POA: Diagnosis not present

## 2015-03-03 DIAGNOSIS — I739 Peripheral vascular disease, unspecified: Secondary | ICD-10-CM

## 2015-03-03 NOTE — Progress Notes (Signed)
Cardiology Office Note   Date:  03/03/2015   ID:  David Pacheco, David Pacheco 01-23-1941, MRN [id number]  PCP:  Kathlee Nations, MD  Cardiologist:  Tonny Bollman, MD    Chief Complaint  Patient presents with  . Coronary Artery Disease     History of Present Illness: David Pacheco is a 74 y.o. male who presents for follow-up of peripheral arterial disease. The patient has a history of left SFA occlusion treated with PTA and stenting. The patient is also had a markedly abnormal EKG that was found to have mild nonobstructive disease by cardiac catheterization.  The patient is doing well. He denies chest pain, chest pressure, shortness of breath, lightheadedness, leg swelling, or syncope. He denies orthopnea or PND. He's had no recent leg pain. He does complain of very easy bruising and bleeding. 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   Past Medical History  Diagnosis Date  . CAD (coronary artery disease)     nonobstructive  . PVD (peripheral vascular disease)     lower extremity PAD eith intermittent claudication  . Essential hypertension   . Dyslipidemia     Past Surgical History  Procedure Laterality Date  . Left superficial femoral artery pta and stenting    . Lower extremity angiogram N/A 12/25/2013    Procedure: LOWER EXTREMITY ANGIOGRAM;  Surgeon: Iran Ouch, MD;  Location: [facility];  Service: Cardiovascular;  Laterality: N/A;    Current Outpatient Prescriptions  Medication Sig Dispense Refill  . amLODipine (NORVASC) 10 MG tablet TAKE 1 TABLET BY MOUTH DAILY 30 tablet 3  . cholecalciferol (VITAMIN D) 1000 UNITS tablet Take 5,000 Units by mouth daily.     . clopidogrel (PLAVIX) 75 MG tablet TAKE 1 TABLET BY MOUTH DAILY 90 tablet 0  . isosorbide mononitrate (IMDUR) 30 MG 24 hr tablet TAKE 1 TABLET BY MOUTH DAILY 30 tablet 3  . lisinopril (PRINIVIL,ZESTRIL) 20 MG tablet TAKE 1 TABLET BY MOUTH DAILY. MUST HAVE APPOINTMENT WITH DOCTOR FOR ADDITIONAL  REFILLS 90 tablet 0  . Multiple Vitamin (MULTIVITAMIN WITH MINERALS) TABS tablet Take 1 tablet by mouth daily.    . pravastatin (PRAVACHOL) 80 MG tablet TAKE 1 TABLET BY MOUTH DAILY 30 tablet 3   No current facility-administered medications for this visit.    Allergies:   Review of patient's allergies indicates no known allergies.   Social History:  The patient  reports that he has been smoking Cigarettes.  He has a 40 pack-year smoking history. He does not have any smokeless tobacco history on file. He reports that he does not drink alcohol or use illicit drugs.   Family History:  The patient's family history includes Coronary artery disease in his brother; Heart attack in his brother; Hyperlipidemia in his father; Stroke in his mother.    ROS:  Please see the history of present illness.  Otherwise, review of systems is positive for easy bruising.  All other systems are reviewed and negative.    PHYSICAL EXAM: VS:  BP 116/60 mmHg  Pulse 65  Ht  (1.702 m)  Wt 151 lb 12.8 oz (68.856 kg)  BMI 23.77 kg/m2 , BMI Body mass index is 23.77 kg/(m^2). GEN: Well nourished, well developed, in no acute distress HEENT: normal Neck: no JVD, no masses. There are soft bilateral carotid bruits present Cardiac: RRR without murmur or gallop  Respiratory:  clear to auscultation bilaterally, normal work of breathing GI: soft, nontender, nondistended, + BS MS: no deformity or atrophy Ext: no pretibial edema Skin: warm and dry, no rash Neuro:  Strength and sensation are intact Psych: euthymic mood, full affect  EKG:  EKG is ordered today. The ekg ordered today shows normal sinus rhythm with frequent PACs, marked ST and T wave abnormality consider anterolateral ischemia. No significant change from previous tracings.  Recent Labs: No results found for requested labs within last 365 days.   Lipid Panel     Component Value Date/Time   CHOL  12/02/2008 0500    146        ATP III  CLASSIFICATION:  <200     mg/dL   Desirable  [id number]  mg/dL   Borderline High  >=119>=240    mg/dL   High   TRIG 89 14/78/295612/29/2009 0500   HDL 22* 12/02/2008 0500   CHOLHDL 6.6 12/02/2008 0500   VLDL 18 12/02/2008 0500   LDLCALC * 12/02/2008 0500    106        Total Cholesterol/HDL:CHD Risk Coronary Heart Disease Risk Table                     Men   Women  1/2 Average Risk   3.4   3.3      Wt Readings from Last 3 Encounters:  03/03/15 151 lb 12.8 oz (68.856 kg)  03/18/14 155 lb (70.308 kg)  12/25/13 161 lb (73.029 kg)    ASSESSMENT AND PLAN: 1.  Lower extremity peripheral arterial disease with intermittent claudication: The patient has had an excellent symptomatic response to his most recent percutaneous intervention. He is due for a repeat duplex and ABI study. He has undergone complex intervention of the SFA/profunda bifurcation. He will discontinue aspirin because of bruising and bleeding, and should continue on long-term Plavix.  2. Markedly abnormal EKG: EKG has marked ST and T-wave changes. These have been seen in the past. He has undergone cardiac catheterization demonstrating no significant CAD. It has been 5 years since his last echocardiogram and I have recommended repeating a 2-D echo to evaluate for LV abnormality such as hypertrophic cardiomyopathy.  3. Hypertension: Blood pressure well controlled on current medical therapy  4. Hyperlipidemia: The patient takes pravastatin. Recent labs drawn by PCP. We will send for a copy of his lab work.  5. Carotid bruits: The patient has significant peripheral arterial disease. We'll check a carotid duplex scan at the time of his other studies.   Current medicines are reviewed with the patient today.  The patient does not have concerns regarding medicines.  The following changes have been made:  Stop Aspirin. Continue Plavix.  Labs/ tests ordered today include:   Orders Placed This Encounter  Procedures  . EKG 12-Lead  . 2D  Echocardiogram with contrast  . Carotid duplex  . Lower Extremity Arterial Duplex Left   Disposition:   FU one year  Signed, Tonny BollmanMichael Aryon Nham, MD  03/03/2015 2:03 PM    [facility] [address], [place], [place]  [address] Phone: [phone]; Fax: [phone]

## 2015-03-03 NOTE — Patient Instructions (Signed)
Your physician has recommended you make the following change in your medication:   STOP Aspirin   Continue Plavix   Your physician has requested that you have an echocardiogram. Echocardiography is a painless test that uses sound waves to create images of your heart. It provides your doctor with information about the size and shape of your heart and how well your heart's chambers and valves are working. This procedure takes approximately one hour. There are no restrictions for this procedure.  Your physician has requested that you have a carotid duplex. This test is an ultrasound of the carotid arteries in your neck. It looks at blood flow through these arteries that supply the brain with blood. Allow one hour for this exam. There are no restrictions or special instructions.  Your physician has requested that you have a left  lower extremity arterial duplex. This test is an ultrasound of the arteries in the legs. It looks at arterial blood flow in the legs. Allow one hour for Lower  Arterial scans. There are no restrictions or special instructions  Your physician wants you to follow-up in: 1 year with Dr. Excell Seltzerooper.  You will receive a reminder letter in the mail two months in advance. If you don't receive a letter, please call our office to schedule the follow-up appointment.

## 2015-03-08 ENCOUNTER — Other Ambulatory Visit: Payer: Self-pay | Admitting: Cardiovascular Disease

## 2015-03-09 ENCOUNTER — Other Ambulatory Visit: Payer: Self-pay | Admitting: Radiology

## 2015-03-09 DIAGNOSIS — I739 Peripheral vascular disease, unspecified: Secondary | ICD-10-CM

## 2015-03-09 DIAGNOSIS — R0989 Other specified symptoms and signs involving the circulatory and respiratory systems: Secondary | ICD-10-CM

## 2015-03-09 DIAGNOSIS — R9431 Abnormal electrocardiogram [ECG] [EKG]: Secondary | ICD-10-CM

## 2015-03-19 ENCOUNTER — Encounter (INDEPENDENT_AMBULATORY_CARE_PROVIDER_SITE_OTHER): Payer: Medicare Other

## 2015-03-19 ENCOUNTER — Other Ambulatory Visit: Payer: Self-pay

## 2015-03-19 ENCOUNTER — Other Ambulatory Visit (INDEPENDENT_AMBULATORY_CARE_PROVIDER_SITE_OTHER): Payer: Medicare Other

## 2015-03-19 DIAGNOSIS — I2510 Atherosclerotic heart disease of native coronary artery without angina pectoris: Secondary | ICD-10-CM | POA: Diagnosis not present

## 2015-03-19 DIAGNOSIS — R9431 Abnormal electrocardiogram [ECG] [EKG]: Secondary | ICD-10-CM | POA: Diagnosis not present

## 2015-03-19 DIAGNOSIS — I10 Essential (primary) hypertension: Secondary | ICD-10-CM

## 2015-03-19 DIAGNOSIS — I6523 Occlusion and stenosis of bilateral carotid arteries: Secondary | ICD-10-CM

## 2015-03-19 DIAGNOSIS — I739 Peripheral vascular disease, unspecified: Secondary | ICD-10-CM

## 2015-03-19 DIAGNOSIS — R0989 Other specified symptoms and signs involving the circulatory and respiratory systems: Secondary | ICD-10-CM

## 2015-03-22 ENCOUNTER — Other Ambulatory Visit: Payer: Self-pay | Admitting: Cardiovascular Disease

## 2015-04-01 ENCOUNTER — Telehealth: Payer: Self-pay | Admitting: Cardiovascular Disease

## 2015-04-01 DIAGNOSIS — I422 Other hypertrophic cardiomyopathy: Secondary | ICD-10-CM

## 2015-04-01 NOTE — Telephone Encounter (Signed)
I spoke with the pt and made him aware of carotid, lower arterial and Echo results.  Per Dr Excell Seltzerooper the pt needs a Cardiac MRI to further evaluate findings on echocardiogram.

## 2015-04-01 NOTE — Telephone Encounter (Signed)
Follow Up  ° °Pt returned call for results//sr  °

## 2015-04-07 ENCOUNTER — Encounter: Payer: Self-pay | Admitting: Cardiovascular Disease

## 2015-04-07 NOTE — Telephone Encounter (Signed)
Pt scheduled for cardiac MRI on 04/22/15.

## 2015-04-22 ENCOUNTER — Ambulatory Visit (HOSPITAL_COMMUNITY)
Admission: RE | Admit: 2015-04-22 | Discharge: 2015-04-22 | Disposition: A | Discharge status: Home / Self Care | Payer: Medicare Other | Source: Ambulatory Visit | Attending: Cardiovascular Disease | Admitting: Cardiovascular Disease

## 2015-04-22 DIAGNOSIS — I422 Other hypertrophic cardiomyopathy: Secondary | ICD-10-CM | POA: Diagnosis not present

## 2015-04-22 LAB — POCT I-STAT CREATININE: Creatinine, Ser: 1 mg/dL (ref 0.61–1.24)

## 2015-04-22 MED ORDER — GADOBENATE DIMEGLUMINE 529 MG/ML IV SOLN
25.0000 mL | Freq: Once | INTRAVENOUS | Status: AC
  Administered 2015-04-22: 25 mL via INTRAVENOUS

## 2015-06-02 ENCOUNTER — Other Ambulatory Visit: Payer: Self-pay | Admitting: Cardiovascular Disease

## 2015-09-07 ENCOUNTER — Telehealth: Payer: Self-pay | Admitting: *Deleted

## 2015-09-07 ENCOUNTER — Telehealth: Payer: Self-pay | Admitting: Cardiovascular Disease

## 2015-09-07 NOTE — Telephone Encounter (Signed)
F/u ° ° ° ° ° °Pt returning Anita's phone call. °

## 2015-09-07 NOTE — Telephone Encounter (Signed)
Lm for pt to call back regarding his cardiac MR

## 2015-09-07 NOTE — Telephone Encounter (Signed)
Returning call.   I had called to see if he had been notified of cardiac MRI.  He states no one had called him.  Advised him of results and that Dr. Excell Seltzer states will follow.  He is scheduled to see Dr. Excell Seltzer in March.  He verbalizes understanding and appreciates call.

## 2016-01-06 ENCOUNTER — Other Ambulatory Visit: Payer: Self-pay | Admitting: Cardiovascular Disease

## 2016-02-05 ENCOUNTER — Other Ambulatory Visit: Payer: Self-pay | Admitting: Cardiovascular Disease

## 2016-02-28 ENCOUNTER — Other Ambulatory Visit: Payer: Self-pay | Admitting: Cardiovascular Disease

## 2016-03-02 ENCOUNTER — Other Ambulatory Visit: Payer: Self-pay | Admitting: *Deleted

## 2016-03-02 MED ORDER — LISINOPRIL 20 MG PO TABS: 20.0000 mg | ORAL_TABLET | Freq: Every day | ORAL | Status: DC

## 2016-03-02 MED ORDER — CLOPIDOGREL BISULFATE 75 MG PO TABS: 75.0000 mg | ORAL_TABLET | Freq: Every day | ORAL | Status: DC

## 2016-03-04 ENCOUNTER — Other Ambulatory Visit: Payer: Self-pay | Admitting: Cardiovascular Disease

## 2016-03-11 ENCOUNTER — Ambulatory Visit: Payer: Medicare Other | Admitting: Cardiovascular Disease

## 2016-04-03 ENCOUNTER — Other Ambulatory Visit: Payer: Self-pay | Admitting: Cardiovascular Disease

## 2016-05-02 ENCOUNTER — Other Ambulatory Visit: Payer: Self-pay | Admitting: Cardiovascular Disease

## 2016-05-29 ENCOUNTER — Other Ambulatory Visit: Payer: Self-pay | Admitting: Cardiovascular Disease

## 2016-05-30 ENCOUNTER — Other Ambulatory Visit: Payer: Self-pay | Admitting: Cardiovascular Disease

## 2016-06-01 ENCOUNTER — Telehealth: Payer: Self-pay | Admitting: Cardiovascular Disease

## 2016-06-01 ENCOUNTER — Telehealth: Payer: Self-pay | Admitting: *Deleted

## 2016-06-01 MED ORDER — ISOSORBIDE MONONITRATE ER 30 MG PO TB24: ORAL_TABLET | ORAL | Status: DC

## 2016-06-01 MED ORDER — PRAVASTATIN SODIUM 80 MG PO TABS: 80.0000 mg | ORAL_TABLET | Freq: Every day | ORAL | Status: DC

## 2016-06-01 MED ORDER — AMLODIPINE BESYLATE 10 MG PO TABS: 10.0000 mg | ORAL_TABLET | Freq: Every day | ORAL | Status: DC

## 2016-06-01 MED ORDER — CLOPIDOGREL BISULFATE 75 MG PO TABS: 75.0000 mg | ORAL_TABLET | Freq: Every day | ORAL | Status: DC

## 2016-06-01 MED ORDER — LISINOPRIL 20 MG PO TABS: 20.0000 mg | ORAL_TABLET | Freq: Every day | ORAL | Status: DC

## 2016-06-01 NOTE — Telephone Encounter (Signed)
MESSAGE  FORWARDED  TO LAUREN TO  GET  APPT  SET UP  FOR PT.  the patient NEEDS  ADDITIONAL  REFILLS FOR  MEDS  WILL  GIVE  ANOTHER  30 DAYS HOPEFULLY  WILL BE ENOUGH UNTIL  APPT TIME .Zack Seal/CY

## 2016-06-01 NOTE — Telephone Encounter (Signed)
New Message  Pt stated he was scheduled an appointment w/ Dr Excell Seltzercooper for 7/3. NO existing or past appointment was scheduled for 06/06/16. Pt requested to speak w/ rN. Please call back and discuss.

## 2016-06-01 NOTE — Telephone Encounter (Signed)
ERROR./CY 

## 2016-06-10 NOTE — Telephone Encounter (Signed)
Pt placed on Dr Cooper's wait list.

## 2016-06-13 ENCOUNTER — Other Ambulatory Visit: Payer: Self-pay | Admitting: Cardiovascular Disease

## 2016-07-07 ENCOUNTER — Encounter (INDEPENDENT_AMBULATORY_CARE_PROVIDER_SITE_OTHER): Payer: Self-pay

## 2016-07-07 ENCOUNTER — Encounter: Payer: Self-pay | Admitting: Cardiovascular Disease

## 2016-07-07 ENCOUNTER — Ambulatory Visit (INDEPENDENT_AMBULATORY_CARE_PROVIDER_SITE_OTHER): Payer: Medicare HMO | Admitting: Cardiovascular Disease

## 2016-07-07 VITALS — BP 154/62 | HR 67 | Ht 67.0 in | Wt 149.8 lb

## 2016-07-07 DIAGNOSIS — I10 Essential (primary) hypertension: Secondary | ICD-10-CM

## 2016-07-07 DIAGNOSIS — I422 Other hypertrophic cardiomyopathy: Secondary | ICD-10-CM

## 2016-07-07 DIAGNOSIS — I739 Peripheral vascular disease, unspecified: Secondary | ICD-10-CM

## 2016-07-07 MED ORDER — LISINOPRIL 40 MG PO TABS: 40.0000 mg | ORAL_TABLET | Freq: Every day | ORAL | 11 refills | Status: DC

## 2016-07-07 NOTE — Patient Instructions (Signed)
Medication Instructions:  Your physician has recommended you make the following change in your medication:  1. STOP Isosorbide MN 2. INCREASE Lisinopril to 40mg  take one tablet by mouth daily   Labwork: No new orders.   Testing/Procedures: Your physician has requested that you have an echocardiogram in 1 YEAR. Echocardiography is a painless test that uses sound waves to create images of your heart. It provides your doctor with information about the size and shape of your heart and how well your heart's chambers and valves are working. This procedure takes approximately one hour. There are no restrictions for this procedure.  Your physician has requested that you have a lower extremity arterial duplex with ABI in 1 YEAR. This test is an ultrasound of the arteries in the legs. It looks at arterial blood flow in the legs.  Allow one hour for Lower Arterial scans. There are no restrictions or special instructions  Follow-Up: Your physician wants you to follow-up in: 1 YEAR with Dr Excell Seltzer.  You will receive a reminder letter in the mail two months in advance. If you don't receive a letter, please call our office to schedule the follow-up appointment.   Any Other Special Instructions Will Be Listed Below (If Applicable).  Please decrease your caffeine consumption and this can help to lower your BP.    Please call 1-800-QUIT-NOW for further assistance in regards to smoking cessation.    If you need a refill on your cardiac medications before your next appointment, please call your pharmacy.

## 2016-07-07 NOTE — Progress Notes (Signed)
Cardiology Office Note Date:  07/07/2016   ID:  David Pacheco, David Pacheco [birth year], MRN [id number]  PCP:  Kathlee Nations, MD  Cardiologist:  Tonny Bollman, MD    Chief Complaint  Patient presents with  . Follow-up    PAD     History of Present Illness: David Pacheco is a 75 y.o. male who presents for follow-up evaluation. 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 He has undergone stenting of the left SFA in the past. He developed restenosis and required atherectomy and drug-eluting balloon angioplasty of the left SFA and profunda. He has done very well since that time and today reports no symptoms of leg pain with walking or at rest.  He has had a long-standing markedly abnormal EKG. Cardiac catheterization demonstrated no evidence of obstructive CAD. Last year he underwent an echo study as well as a cardiac MRI. These studies have findings consistent with apical hypertrophic cardiomyopathy. The patient has been completely asymptomatic. He specifically denies chest pain, chest pressure, shortness of breath, heart palpitations, lightheadedness, or syncope.   Past Medical History:  Diagnosis Date  . CAD (coronary artery disease)    nonobstructive  . Dyslipidemia   . Essential hypertension   . PVD (peripheral vascular disease) (HCC)    lower extremity PAD eith intermittent claudication    Past Surgical History:  Procedure Laterality Date  . left superficial femoral artery PTA and stenting    . LOWER EXTREMITY ANGIOGRAM N/A 12/25/2013   Procedure: LOWER EXTREMITY ANGIOGRAM;  Surgeon: Iran Ouch, MD;  Location: [facility];  Service: Cardiovascular;  Laterality: N/A;    Current Outpatient Prescriptions  Medication Sig Dispense Refill  . amLODipine (NORVASC) 10 MG tablet Take 1 tablet (10 mg total) by mouth daily. 30 tablet 0  . cholecalciferol (VITAMIN D) 1000 UNITS tablet Take 5,000 Units by mouth daily.     . clopidogrel  (PLAVIX) 75 MG tablet Take 1 tablet (75 mg total) by mouth daily. 30 tablet 1  . lisinopril (PRINIVIL,ZESTRIL) 40 MG tablet Take 1 tablet (40 mg total) by mouth daily. 30 tablet 11  . pravastatin (PRAVACHOL) 80 MG tablet Take 1 tablet (80 mg total) by mouth daily. 30 tablet 1   No current facility-administered medications for this visit.     Allergies:   Review of patient's allergies indicates no known allergies.   Social History:  The patient  reports that he has been smoking Cigarettes.  He has a 40.00 pack-year smoking history. He does not have any smokeless tobacco history on file. He reports that he does not drink alcohol or use drugs.   Family History:  The patient's  family history includes Coronary artery disease in his brother; Heart attack in his brother; Hyperlipidemia in his father; Stroke in his mother.    ROS:  Please see the history of present illness.  All other systems are reviewed and negative.    PHYSICAL EXAM: VS:  BP (!) 154/62 (BP Location: Left Arm, Patient Position: Sitting, Cuff Size: Normal)   Pulse 67   Ht 5\' 7"  (1.702 m)   Wt 149 lb 12.8 oz (67.9 kg)   BMI 23.46 kg/m  , BMI Body mass index is 23.46 kg/m. GEN: Well nourished, well developed, in no acute distress  HEENT: normal  Neck: no JVD, no masses. No carotid bruits Cardiac: RRR without murmur or gallop                Respiratory:  clear to auscultation bilaterally, normal work of breathing GI: soft, nontender, nondistended, + BS MS: no deformity or atrophy  Ext: no pretibial edema, pedal pulses 2+= bilaterally Skin: warm and dry, no rash Neuro:  Strength and sensation are intact Psych: euthymic mood, full affect  EKG:  EKG is ordered today. The ekg ordered today shows normal sinus rhythm with sinus arrhythmia, frequent PACs, LVH with marked T-wave abnormality unchanged from previous tracings  Recent Labs: No results found for requested labs within last 8760 hours.   Lipid Panel       Component Value Date/Time   CHOL  12/02/2008 0500    146        ATP III CLASSIFICATION:  <200     mg/dL   Desirable  664-403  mg/dL   Borderline High  >=474    mg/dL   High   TRIG 89 25/95/6387 0500   HDL 22 (L) 12/02/2008 0500   CHOLHDL 6.6 12/02/2008 0500   VLDL 18 12/02/2008 0500   LDLCALC (H) 12/02/2008 0500    106        Total Cholesterol/HDL:CHD Risk Coronary Heart Disease Risk Table                     Men   Women  1/2 Average Risk   3.4   3.3      Wt Readings from Last 3 Encounters:  07/07/16 149 lb 12.8 oz (67.9 kg)  03/03/15 151 lb 12.8 oz (68.9 kg)  03/18/14 155 lb (70.3 kg)     Cardiac Studies Reviewed: Echo 03/19/2015: Study Conclusions  - Left ventricle: The cavity size was normal. Wall thickness was increased in a pattern of mild LVH. Systolic function was normal. The estimated ejection fraction was in the range of 60% to 65%. Indeterminant diastolic function. The apical views are foreshortened and the apex is not fully visualized, however there is suggestion of possible focal apical hypertrophy. 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 Consider echocontrast study to further evaluate or cardiac MRI. - Aortic valve: Mildly calcified annulus. Trileaflet; mildly thickened leaflets. Valve area (VTI): 2.89 cm^2. Valve area (Vmax): 3.12 cm^2. Valve area (Vmean): 2.7 cm^2. - Mitral valve: Mildly calcified annulus. Mildly thickened leaflets  MRI 04/22/2015: FINDINGS: Limited views of the lung fields showed no gross abnormalities.  Normal left ventricular size with marked asymmetric hypertrophy of the apical wall segments consistent with apical hypertrophic cardiomyopathy. The basal segments were normal in thickness. There was no systolic anterior motion of the mitral valve or evidence for LV  outflow tract gradient. 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 Normal LV wall motion with EF 72%. The right ventricle was normal in size and systolic function. The atria were normal sizes. There was no significant mitral regurgitation. The aortic valve was trileaflet with no significant regurgitation or stenosis.  On delayed enhancement imaging, there was mid-wall late gadolinium enhancement (LGE) in the apical septum and the apical inferior wall.  MEASUREMENTS: MEASUREMENTS LV EDV 123 mL  LV SV 88 mL  LV EF 72%  IMPRESSION: 1. Normal LV systolic function, EF 72%. Morphology of the LV was consistent with apical hypertrophic cardiomyopathy.  2.  LGE pattern consistent with apical hypertrophic cardiomyopathy.  ASSESSMENT AND PLAN: 1.  Apical hypertrophic cardiomyopathy: The  patient is asymptomatic. Will repeat an echocardiogram prior to his visit next year. Previous echo and MRI studies are reviewed today.  2. Peripheral arterial disease with claudication, no symptoms at present. He has undergone PTA, atherectomy, and stent procedures on the left SFA. He has bounding pulses in both the left DP and PT area. Since he is asymptomatic with a normal pulse exam Will update ABIs and lower extremity arterial studies next year for his office.   3. Essential hypertension: Suboptimal blood pressure control. Increase lisinopril to 40 mg daily. Discontinue isosorbide as there is no indication for this. Advised smoking cessation and caffeine reduction. The patient drinks 2 pots of coffee every day.  4. Hyperlipidemia: Continue pravastatin. He brings in labs today and his LDL cholesterol is 63, HDL 32, triglycerides 80, and cholesterol 111.  Current medicines are reviewed with the patient today.  The patient does not have concerns regarding medicines.  Labs/ tests ordered today include:   Orders Placed This  Encounter  Procedures  . EKG 12-Lead  . ECHOCARDIOGRAM COMPLETE   Disposition:   FU one year with an echo and lower extremity arterial studies  Signed, Tonny Bollman, MD  07/07/2016 1:38 PM    [facility] [address] [place], [place], [place]  [address] Phone: [phone]; Fax: [phone]

## 2016-07-11 ENCOUNTER — Other Ambulatory Visit: Payer: Self-pay | Admitting: Cardiovascular Disease

## 2016-07-29 ENCOUNTER — Other Ambulatory Visit: Payer: Self-pay

## 2016-07-29 MED ORDER — PRAVASTATIN SODIUM 80 MG PO TABS: 80.0000 mg | ORAL_TABLET | Freq: Every day | ORAL | 3 refills | Status: DC

## 2017-01-20 ENCOUNTER — Encounter (INDEPENDENT_AMBULATORY_CARE_PROVIDER_SITE_OTHER): Payer: Medicare HMO | Admitting: Ophthalmology

## 2017-01-20 DIAGNOSIS — H27122 Anterior dislocation of lens, left eye: Secondary | ICD-10-CM

## 2017-01-20 DIAGNOSIS — H35033 Hypertensive retinopathy, bilateral: Secondary | ICD-10-CM

## 2017-01-20 DIAGNOSIS — H43813 Vitreous degeneration, bilateral: Secondary | ICD-10-CM | POA: Diagnosis not present

## 2017-01-20 DIAGNOSIS — I10 Essential (primary) hypertension: Secondary | ICD-10-CM

## 2017-01-20 DIAGNOSIS — H34831 Tributary (branch) retinal vein occlusion, right eye, with macular edema: Secondary | ICD-10-CM

## 2017-01-31 NOTE — Progress Notes (Deleted)
Cardiology Office Note    Date:  01/31/2017   ID:  Mccrae, Speciale 09/12/1941, MRN [id number]  PCP:  Kathlee Nations, MD  Cardiologist:  Dr. Excell Seltzer PVD: Dr. Kirke Corin  Chief Complaint: High blood pressure  History of Present Illness:   David Pacheco is a 76 y.o. male CAD, apical hypertrophic cardiomyopathy, hypertension, hyperlipidemia, PVD presents for follow-up.  ? Smoking    He has a history of long total a left SFA occlusion and was treated with percutaneous stenting in 2010. Angiography in January 2015 showed moderate left common femoral artery stenosis. There was evidence of severe in-stent restenosis in the left SFA with severe ostial stenosis in the left profunda artery which was likely the cause of his thigh claudication. S/p directional atherectomy and drug coated balloon angioplasty of the left SFA with kissing balloon angioplasty of the left profunda and ostial SFA with excellent results. Resolution of symptoms. Last seen by Dr. Kirke Corin 03/2014.  He has had a long-standing markedly abnormal EKG. Cardiac catheterization demonstrated no evidence of obstructive CAD.  He underwent an echo study as well as a cardiac MRI in 2016. These studies have findings consistent with apical hypertrophic cardiomyopathy. Last seen by Dr. Excell Seltzer 07/2016.   Recently had      Past Medical History:  Diagnosis Date  . CAD (coronary artery disease)    nonobstructive  . Dyslipidemia   . Essential hypertension   . PVD (peripheral vascular disease) (HCC)    lower extremity PAD eith intermittent claudication    Past Surgical History:  Procedure Laterality Date  . left superficial femoral artery PTA and stenting    . LOWER EXTREMITY ANGIOGRAM N/A 12/25/2013   Procedure: LOWER EXTREMITY ANGIOGRAM;  Surgeon: Iran Ouch, MD;  Location: [facility];  Service: Cardiovascular;  Laterality: N/A;    Current Medications: Prior to Admission medications   Medication Sig Start Date End Date Taking?  Authorizing Provider  amLODipine (NORVASC) 10 MG tablet TAKE 1 TABLET BY MOUTH EVERY DAY 07/11/16   Tonny Bollman, MD  cholecalciferol (VITAMIN D) 1000 UNITS tablet Take 5,000 Units by mouth daily.     Historical Provider, MD  clopidogrel (PLAVIX) 75 MG tablet Take 1 tablet (75 mg total) by mouth daily. 06/01/16   Tonny Bollman, MD  lisinopril (PRINIVIL,ZESTRIL) 40 MG tablet Take 1 tablet (40 mg total) by mouth daily. 07/07/16   Tonny Bollman, MD  pravastatin (PRAVACHOL) 80 MG tablet Take 1 tablet (80 mg total) by mouth daily. 07/29/16   Tonny Bollman, MD    Allergies:   Patient has no known allergies.   Social History   Social History  . Marital status: Single    Spouse name: N/A  . Number of children: N/A  . Years of education: N/A   Occupational History  . retired    Social History Main Topics  . Smoking status: Current Every Day Smoker    Packs/day: 1.00    Years: 40.00    Types: Cigarettes  . Smokeless tobacco: Not on file  . Alcohol use No  . Drug use: No  . Sexual activity: Not on file   Other Topics Concern  . Not on file   Social History Narrative  . No narrative on file     Family History:  The patient's family history includes Coronary artery disease in his brother; Heart attack in his brother; Hyperlipidemia in his father; Stroke in his mother. ***  ROS:   Please see the history of  present illness.    ROS All other systems reviewed and are negative.   PHYSICAL EXAM:   VS:  There were no vitals taken for this visit.   GEN: Well nourished, well developed, in no acute distress  HEENT: normal  Neck: no JVD, carotid bruits, or masses Cardiac: ***RRR; no murmurs, rubs, or gallops,no edema  Respiratory:  clear to auscultation bilaterally, normal work of breathing GI: soft, nontender, nondistended, + BS MS: no deformity or atrophy  Skin: warm and dry, no rash Neuro:  Alert and Oriented x 3, Strength and sensation are intact Psych: euthymic mood, full  affect  Wt Readings from Last 3 Encounters:  07/07/16 149 lb 12.8 oz (67.9 kg)  03/03/15 151 lb 12.8 oz (68.9 kg)  03/18/14 155 lb (70.3 kg)      Studies/Labs Reviewed:   EKG:  EKG is ordered today.  The ekg ordered today demonstrates ***  Recent Labs: No results found for requested labs within last 8760 hours.   Lipid Panel    Component Value Date/Time   CHOL  12/02/2008 0500    146        ATP III CLASSIFICATION:  <200     mg/dL   Desirable  161-096  mg/dL   Borderline High  >=045    mg/dL   High   TRIG 89 40/98/1191 0500   HDL 22 (L) 12/02/2008 0500   CHOLHDL 6.6 12/02/2008 0500   VLDL 18 12/02/2008 0500   LDLCALC (H) 12/02/2008 0500    106        Total Cholesterol/HDL:CHD Risk Coronary Heart Disease Risk Table                     Men   Women  1/2 Average Risk   3.4   3.3    Additional studies/ records that were reviewed today include:   Echocardiogram: 03/19/2015 Study Conclusions  - Left ventricle: The cavity size was normal. Wall thickness was increased in a pattern of mild LVH. Systolic function was normal. The estimated ejection fraction was in the range of 60% to 65%. Indeterminant diastolic function. The apical views are foreshortened and the apex is not fully visualized, however there is suggestion of possible focal apical hypertrophy. 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 Consider echocontrast study to further evaluate or cardiac MRI. - Aortic valve: Mildly calcified annulus. Trileaflet; mildly thickened leaflets. Valve area (VTI): 2.89 cm^2. Valve area (Vmax): 3.12 cm^2. Valve area (Vmean): 2.7 cm^2. - Mitral valve: Mildly calcified annulus. Mildly thickened leaflets  Cardiac Catheterization: 11/2008 CORONARIES:  Left main was nonexistent as there were separate ostia.  The LAD  had a long proximal 30% stenosis.  There were mid diffuse  luminal irregularities.  The vessel was large wrapping the apex.  First  diagonal was large with luminal irregularities.  The circumflex was a  dominant vessel.  The AV groove was normal.  There was a first obtuse  marginal, which was large with ostial 30% stenosis.  There were 2 small  posterolaterals, which were normal.  There was a PDA, which was moderate  sized and normal.  The right coronary artery was a nondominant vessel.  It was normal throughout its course.  Left ventriculogram:  Left  ventriculogram was obtained in the RAO projection.  The EF of 65% with  normal wall motion.  CONCLUSION:  Nonobstructive coronary artery disease.  He does have chest  pain, which is somewhat atypical.  His EKG is remarkable with dynamic T-  wave inversions.  The question of coronary spasm cannot be resolved with  this procedure.   PLAN:  We will manage the patient presumably for coronary spasm while we  ask his primary care doctor to consider other etiologies of his chest  discomfort.   PERIPHERAL VASCULAR PROCEDURE 12/25/2013 Conclusions: 1. No significant aortoiliac disease. 2. Moderate left common femoral artery stenosis. 3. 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 Successful directional atherectomy and not coated balloon angioplasty to the left SFA. 5. Successful kissing balloon angioplasty of the ostial profunda and SFA  Recommendations:  Continue dual antiplatelet therapy for at least one month. Smoking cessation is strongly advised.  LE arterial doppler 03/2015 - Patent left SFA stent and normal ABI's bilaterally   ASSESSMENT & PLAN:    1. ***      Echo and LE arterial doppler as schedule in 07/2017.  Medication Adjustments/Labs and Tests Ordered: Current medicines are reviewed at length with the patient today.  Concerns regarding medicines are outlined  above.  Medication changes, Labs and Tests ordered today are listed in the Patient Instructions below. There are no Patient Instructions on file for this visit.   David PontSigned, Eleaner Dibartolo, [place]  01/31/2017 9:39 AM    [facility] [address], [place], [place]  [address] Phone: [phone]; Fax: [phone]

## 2017-02-01 ENCOUNTER — Ambulatory Visit: Payer: Medicare HMO | Admitting: Physician Assistant

## 2017-02-14 ENCOUNTER — Encounter: Payer: Self-pay | Admitting: Physician Assistant

## 2017-02-14 ENCOUNTER — Other Ambulatory Visit (HOSPITAL_COMMUNITY): Payer: Self-pay | Admitting: Physical Medicine & Rehabilitation

## 2017-02-14 ENCOUNTER — Ambulatory Visit (INDEPENDENT_AMBULATORY_CARE_PROVIDER_SITE_OTHER): Payer: Medicare HMO | Admitting: Physician Assistant

## 2017-02-14 VITALS — BP 150/58 | HR 69 | Ht 67.0 in | Wt 152.8 lb

## 2017-02-14 DIAGNOSIS — I119 Hypertensive heart disease without heart failure: Secondary | ICD-10-CM | POA: Diagnosis not present

## 2017-02-14 DIAGNOSIS — E785 Hyperlipidemia, unspecified: Secondary | ICD-10-CM

## 2017-02-14 DIAGNOSIS — I779 Disorder of arteries and arterioles, unspecified: Secondary | ICD-10-CM | POA: Diagnosis not present

## 2017-02-14 DIAGNOSIS — I739 Peripheral vascular disease, unspecified: Secondary | ICD-10-CM | POA: Diagnosis not present

## 2017-02-14 DIAGNOSIS — Z720 Tobacco use: Secondary | ICD-10-CM

## 2017-02-14 DIAGNOSIS — I422 Other hypertrophic cardiomyopathy: Secondary | ICD-10-CM | POA: Diagnosis not present

## 2017-02-14 DIAGNOSIS — I2510 Atherosclerotic heart disease of native coronary artery without angina pectoris: Secondary | ICD-10-CM | POA: Diagnosis not present

## 2017-02-14 HISTORY — DX: Disorder of arteries and arterioles, unspecified: I77.9

## 2017-02-14 MED ORDER — CARVEDILOL 6.25 MG PO TABS: 6.2500 mg | ORAL_TABLET | Freq: Two times a day (BID) | ORAL | 3 refills | Status: DC

## 2017-02-14 NOTE — Progress Notes (Signed)
Cardiology Office Note:    Date:  02/14/2017   ID:  David Pacheco 11-02-1941, MRN [id number]  PCP:  Eber Hong, MD  Cardiologist:  Dr. Sherren Mocha   Electrophysiologist:  n/a  Referring MD: Eber Hong, MD   Chief Complaint  Patient presents with  . Follow-up    HTN    History of Present Illness:    David Pacheco is a 76 y.o. male with a hx of apical hypertrophic cardiomyopathy, PAD status post prior stenting to the left SFA, HTN, HL.  Last seen by Dr. Burt Knack 8/17.  He returns for follow-up. He is here alone today. He recently saw an ophthalmologist for retinal hemorrhage. He was told that it may have been related to elevated blood pressures. His blood pressure at home ranges 140-160. Overall, he's been doing well. He denies chest pain, shortness breath, syncope, claudication. He denies orthopnea, PND or edema. He continues to smoke. He has a chronic cough. He denies hemoptysis.  Prior CV studies:   The following studies were reviewed today:  Cardiac MRI 5/16 IMPRESSION: 1. Normal LV systolic function, EF 62%. Morphology of the LV was consistent with apical hypertrophic cardiomyopathy. 2. LGE pattern consistent with apical hypertrophic cardiomyopathy.  Echo 4/16 Mild LVH, EF 60-65, indeterminate diastolic function, focal apical hypertrophy, MAC  LE arterial duplex/ABIs 4/16 Diffuse plaque throughout the left lower extremity, with mildly elevated velocities in the CFA. Widely patent left SFA stent. Normal ABIs  Carotid US 4/16 Bilateral ICA 1-39 FU 2 years  Myocardial Perfusion Imaging Study 11/12 Septal thinning, no ischemia, EF 54  LHC 12/09 LAD proximal 30 LCx with OM1 ostial 30 EF 69  Past Medical History:  Diagnosis Date  . CAD (coronary artery disease)    nonobstructive >> LHC 12/09: pLAD 30, oOM1 30, EF 65 // Nuc study 11/12 Septal thinning, no ischemia, EF 54  . Carotid artery disease ([place]) 02/14/2017   Carotid US 4/16: Bilateral ICA 1-39 >> FU  2 years  . Dyslipidemia   . Essential hypertension   . PVD (peripheral vascular disease) ([place])    lower extremity PAD eith intermittent claudication    Past Surgical History:  Procedure Laterality Date  . left superficial femoral artery PTA and stenting    . LOWER EXTREMITY ANGIOGRAM N/A 12/25/2013   Procedure: LOWER EXTREMITY ANGIOGRAM;  Surgeon: Wellington Hampshire, MD;  Location: [facility];  Service: Cardiovascular;  Laterality: N/A;    Current Medications: Current Meds  Medication Sig  . amLODipine (NORVASC) 10 MG tablet TAKE 1 TABLET BY MOUTH EVERY DAY  . cholecalciferol (VITAMIN D) 1000 UNITS tablet Take 5,000 Units by mouth daily.   . clopidogrel (PLAVIX) 75 MG tablet Take 1 tablet (75 mg total) by mouth daily.  Marland Kitchen lisinopril (PRINIVIL,ZESTRIL) 40 MG tablet Take 1 tablet (40 mg total) by mouth daily.  . pravastatin (PRAVACHOL) 80 MG tablet Take 1 tablet (80 mg total) by mouth daily.     Allergies:   Patient has no known allergies.   Social History   Social History  . Marital status: Single    Spouse name: N/A  . Number of children: N/A  . Years of education: N/A   Occupational History  . retired    Social History Main Topics  . Smoking status: Current Every Day Smoker    Packs/day: 1.00    Years: 40.00    Types: Cigarettes  . Smokeless tobacco: Never Used  . Alcohol use No  . Drug use:  No  . Sexual activity: Not Asked   Other Topics Concern  . None   Social History Narrative  . None     Family History  Problem Relation Age of Onset  . Stroke Mother   . Coronary artery disease Brother     s/p CABG at age 73  . Heart attack Brother     at age 74  . Hyperlipidemia Father      ROS:   Please see the history of present illness.    Review of Systems  Eyes: Positive for visual disturbance.  Respiratory: Positive for cough.    All other systems reviewed and are negative.   EKGs/Labs/Other Test Reviewed:    EKG:  EKG is  ordered today.  The ekg  ordered today demonstrates NSR, HR 69, normal axis, LVH, T-wave inversions 1, 2, aVL, aVF, V3-V6, QTc 456 ms, no change from prior tracing dated 07/07/16  Recent Labs: No results found for requested labs within last 8760 hours.   Recent Lipid Panel    Component Value Date/Time   CHOL  12/02/2008 0500    146        ATP III CLASSIFICATION:  <200     mg/dL   Desirable  200-239  mg/dL   Borderline High  >=240    mg/dL   High   TRIG 89 12/02/2008 0500   HDL 22 (L) 12/02/2008 0500   CHOLHDL 6.6 12/02/2008 0500   VLDL 18 12/02/2008 0500   LDLCALC (H) 12/02/2008 0500    106        Total Cholesterol/HDL:CHD Risk Coronary Heart Disease Risk Table                     Men   Women  1/2 Average Risk   3.4   3.3     Physical Exam:    VS:  BP (!) 150/58 (BP Location: Left Arm, Patient Position: Sitting, Cuff Size: Normal)   Pulse 69   Ht _0  (1.702 m)   Wt 152 lb 12.8 oz (69.3 kg)   BMI 23.93 kg/m     Wt Readings from Last 3 Encounters:  02/14/17 152 lb 12.8 oz (69.3 kg)  07/07/16 149 lb 12.8 oz (67.9 kg)  03/03/15 151 lb 12.8 oz (68.9 kg)     Physical Exam  Constitutional: He is oriented to person, place, and time. He appears well-developed and well-nourished. No distress.  HENT:  Head: Normocephalic and atraumatic.  Eyes: No scleral icterus.  Neck: No JVD present.  Cardiovascular: Normal rate, regular rhythm and normal heart sounds.   No murmur heard. Pulmonary/Chest: He has decreased breath sounds. He has no wheezes. He has no rhonchi.  Abdominal: There is no tenderness.  Musculoskeletal: He exhibits no edema.  Neurological: He is alert and oriented to person, place, and time.  Skin: Skin is warm and dry.  Psychiatric: He has a normal mood and affect.    ASSESSMENT:    1. Hypertensive heart disease without CHF   2. Apical variant hypertrophic cardiomyopathy ([place])   3. Non-obstructive CAD by Cardiac Cath in 2009   4. PAD (peripheral artery disease) ([place])   5.  Hyperlipidemia, unspecified hyperlipidemia type   6. Bilateral carotid artery disease ([place])   7. Tobacco abuse    PLAN:    In order of problems listed above:   1. Hypertensive heart disease without CHF -  BP is above target.  He is on max dose Norvasc and  Lisinopril.  He has apparently been seen by ophthalmology recently for retinal hemorrhage. I will start him on carvedilol 6.25 mg twice a day. I will have his PCP obtain a follow-up ECG in 1-2 weeks to recheck his heart rate.  2. Apical variant hypertrophic cardiomyopathy ([place]) -  He is asymptomatic. Dr. Burt Knack recommended follow-up echocardiogram prior to his next appointment later this year. Arrange follow-up echocardiogram in 6 months.  3. Non-obstructive CAD by Cardiac Cath in 2009 -  No angina. Continue Plavix, statin.  4. PAD (peripheral artery disease) (HCC) -  No claudication. He is due for follow-up ABIs and lower extremity or tail Dopplers. This will be arranged in 6 months.  5. Hyperlipidemia, unspecified hyperlipidemia type -  Managed by primary care. Continue statin. Goal LDL <70.  6. Bilateral carotid artery disease ([place]) -  He is due for follow-up carotid Dopplers. This will be arranged in 6 months.  7. Tobacco abuse -  I have recommended cessation.   Dispo:  Return in about 6 months (around 08/17/2017) for Routine Follow Up, Follow up after testing.    Medication Adjustments/Labs and Tests Ordered: Current medicines are reviewed at length with the patient today.  Concerns regarding medicines are outlined above.  Medication changes, Labs and Tests ordered today are outlined in the Patient Instructions noted below. Patient Instructions  Medication Instructions:  Your physician has recommended you make the following change in your medication: Start Coreg 6.25 mg one tablet twice daily.  Labwork: None ordered   Testing/Procedures: Your physician has requested that you have an echocardiogram. Echocardiography is  a painless test that uses sound waves to create images of your heart. It provides your doctor with information about the size and shape of your heart and how well your heart's chambers and valves are working. This procedure takes approximately one hour. There are no restrictions for this procedure. This is to be done at [facility] 08/2017  Your physician has requested that you have a lower extremity arterial exercise duplex. During this test, exercise and ultrasound are used to evaluate arterial blood flow in the legs. Allow one hour for this exam. There are no restrictions or special instructions. This is to be done at [facility] 08/2017  Your physician has requested that you have a carotid duplex. This test is an ultrasound of the carotid arteries in your neck. It looks at blood flow through these arteries that supply the brain with blood. Allow one hour for this exam. There are no restrictions or special instructions. This is to be done at [facility] 08/2017  Follow-Up: Your physician wants you to follow-up in: 6 months with Dr. Burt Knack. You will receive a reminder letter in the mail two months in advance. If you don't receive a letter, please call our office to schedule the follow-up appointment.  Any Other Special Instructions Will Be Listed Below (If Applicable). We are giving you a prescription for you to have an EKG done at your primary care office visit with the results to be faxed to Richardson Dopp PA-C at [phone]  If you need a refill on your cardiac medications before your next appointment, please call your pharmacy.  Signed, Richardson Dopp, PA-C  02/14/2017 4:29 PM    [facility] [address], [place], [place]  [address] Phone: [phone]; Fax: [phone]

## 2017-02-14 NOTE — Patient Instructions (Addendum)
Medication Instructions:  Your physician has recommended you make the following change in your medication: Start Coreg 6.25 mg one tablet twice daily.  Labwork: None ordered   Testing/Procedures: Your physician has requested that you have an echocardiogram. Echocardiography is a painless test that uses sound waves to create images of your heart. It provides your doctor with information about the size and shape of your heart and how well your heart's chambers and valves are working. This procedure takes approximately one hour. There are no restrictions for this procedure. [facility]  Your physician has requested that you have a lower extremity arterial exercise duplex. During this test, exercise and ultrasound are used to evaluate arterial blood flow in the legs. Allow one hour for this exam. There are no restrictions or special instructions. [facility]  Your physician has requested that you have a carotid duplex. This test is an ultrasound of the carotid arteries in your neck. It looks at blood flow through these arteries that supply the brain with blood. Allow one hour for this exam. There are no restrictions or special instructions. [facility]  Follow-Up: Your physician wants you to follow-up in: 6 months with Dr. Excell Seltzerooper. You will receive a reminder letter in the mail two months in advance. If you don't receive a letter, please call our office to schedule the follow-up appointment.  Any Other Special Instructions Will Be Listed Below (If Applicable). We are giving you a prescription for you to have an EKG done at your primary care office visit with the results to be faxed to [name] PA-C at [phone]  If you need a refill on your cardiac medications before your next appointment, please call your pharmacy.

## 2017-02-16 ENCOUNTER — Encounter (INDEPENDENT_AMBULATORY_CARE_PROVIDER_SITE_OTHER): Payer: Medicare HMO | Admitting: Ophthalmology

## 2017-02-16 DIAGNOSIS — I10 Essential (primary) hypertension: Secondary | ICD-10-CM

## 2017-02-16 DIAGNOSIS — H34831 Tributary (branch) retinal vein occlusion, right eye, with macular edema: Secondary | ICD-10-CM | POA: Diagnosis not present

## 2017-02-16 DIAGNOSIS — H43813 Vitreous degeneration, bilateral: Secondary | ICD-10-CM | POA: Diagnosis not present

## 2017-02-16 DIAGNOSIS — H35033 Hypertensive retinopathy, bilateral: Secondary | ICD-10-CM | POA: Diagnosis not present

## 2017-02-20 ENCOUNTER — Other Ambulatory Visit: Payer: Self-pay | Admitting: Cardiovascular Disease

## 2017-02-21 NOTE — Addendum Note (Signed)
Addended by: Kerrie BuffaloANIELS, MICHELE on: 02/21/2017 02:21 PM   Modules accepted: Orders

## 2017-03-16 ENCOUNTER — Encounter (INDEPENDENT_AMBULATORY_CARE_PROVIDER_SITE_OTHER): Payer: Medicare HMO | Admitting: Ophthalmology

## 2017-03-16 DIAGNOSIS — H34831 Tributary (branch) retinal vein occlusion, right eye, with macular edema: Secondary | ICD-10-CM

## 2017-03-16 DIAGNOSIS — H43813 Vitreous degeneration, bilateral: Secondary | ICD-10-CM | POA: Diagnosis not present

## 2017-03-16 DIAGNOSIS — I10 Essential (primary) hypertension: Secondary | ICD-10-CM

## 2017-03-16 DIAGNOSIS — H35033 Hypertensive retinopathy, bilateral: Secondary | ICD-10-CM

## 2017-04-11 ENCOUNTER — Encounter (INDEPENDENT_AMBULATORY_CARE_PROVIDER_SITE_OTHER): Payer: Medicare HMO | Admitting: Ophthalmology

## 2017-04-11 DIAGNOSIS — H35033 Hypertensive retinopathy, bilateral: Secondary | ICD-10-CM

## 2017-04-11 DIAGNOSIS — H43813 Vitreous degeneration, bilateral: Secondary | ICD-10-CM

## 2017-04-11 DIAGNOSIS — H34831 Tributary (branch) retinal vein occlusion, right eye, with macular edema: Secondary | ICD-10-CM | POA: Diagnosis not present

## 2017-04-11 DIAGNOSIS — I10 Essential (primary) hypertension: Secondary | ICD-10-CM | POA: Diagnosis not present

## 2017-04-11 DIAGNOSIS — H2512 Age-related nuclear cataract, left eye: Secondary | ICD-10-CM | POA: Diagnosis not present

## 2017-04-16 ENCOUNTER — Other Ambulatory Visit: Payer: Self-pay | Admitting: Cardiovascular Disease

## 2017-04-16 DIAGNOSIS — I10 Essential (primary) hypertension: Secondary | ICD-10-CM

## 2017-04-16 DIAGNOSIS — I422 Other hypertrophic cardiomyopathy: Secondary | ICD-10-CM

## 2017-05-09 ENCOUNTER — Encounter (INDEPENDENT_AMBULATORY_CARE_PROVIDER_SITE_OTHER): Payer: Medicare HMO | Admitting: Ophthalmology

## 2017-06-05 ENCOUNTER — Encounter (INDEPENDENT_AMBULATORY_CARE_PROVIDER_SITE_OTHER): Payer: Medicare HMO | Admitting: Ophthalmology

## 2017-06-05 DIAGNOSIS — H35033 Hypertensive retinopathy, bilateral: Secondary | ICD-10-CM | POA: Diagnosis not present

## 2017-06-05 DIAGNOSIS — H34831 Tributary (branch) retinal vein occlusion, right eye, with macular edema: Secondary | ICD-10-CM

## 2017-06-05 DIAGNOSIS — H43813 Vitreous degeneration, bilateral: Secondary | ICD-10-CM | POA: Diagnosis not present

## 2017-06-05 DIAGNOSIS — I10 Essential (primary) hypertension: Secondary | ICD-10-CM

## 2017-06-05 DIAGNOSIS — H2512 Age-related nuclear cataract, left eye: Secondary | ICD-10-CM

## 2017-06-30 ENCOUNTER — Encounter (INDEPENDENT_AMBULATORY_CARE_PROVIDER_SITE_OTHER): Payer: Medicare HMO | Admitting: Ophthalmology

## 2017-07-17 ENCOUNTER — Encounter (INDEPENDENT_AMBULATORY_CARE_PROVIDER_SITE_OTHER): Payer: Medicare HMO | Admitting: Ophthalmology

## 2017-07-17 DIAGNOSIS — I10 Essential (primary) hypertension: Secondary | ICD-10-CM | POA: Diagnosis not present

## 2017-07-17 DIAGNOSIS — H35033 Hypertensive retinopathy, bilateral: Secondary | ICD-10-CM

## 2017-07-17 DIAGNOSIS — H43813 Vitreous degeneration, bilateral: Secondary | ICD-10-CM

## 2017-07-17 DIAGNOSIS — H34831 Tributary (branch) retinal vein occlusion, right eye, with macular edema: Secondary | ICD-10-CM

## 2017-07-19 ENCOUNTER — Other Ambulatory Visit: Payer: Self-pay | Admitting: Cardiovascular Disease

## 2017-08-09 ENCOUNTER — Ambulatory Visit (HOSPITAL_COMMUNITY): Admission: RE | Admit: 2017-08-09 | Payer: Medicare HMO | Source: Ambulatory Visit

## 2017-08-09 ENCOUNTER — Ambulatory Visit (HOSPITAL_COMMUNITY): Payer: Medicare HMO

## 2017-08-14 ENCOUNTER — Ambulatory Visit (HOSPITAL_COMMUNITY): Payer: Medicare HMO | Attending: Physician Assistant

## 2017-08-21 ENCOUNTER — Encounter (INDEPENDENT_AMBULATORY_CARE_PROVIDER_SITE_OTHER): Payer: Medicare HMO | Admitting: Ophthalmology

## 2017-08-23 ENCOUNTER — Other Ambulatory Visit: Payer: Self-pay | Admitting: Cardiovascular Disease

## 2017-08-28 ENCOUNTER — Encounter (INDEPENDENT_AMBULATORY_CARE_PROVIDER_SITE_OTHER): Payer: Medicare HMO | Admitting: Ophthalmology

## 2017-08-28 DIAGNOSIS — H43813 Vitreous degeneration, bilateral: Secondary | ICD-10-CM

## 2017-08-28 DIAGNOSIS — H34831 Tributary (branch) retinal vein occlusion, right eye, with macular edema: Secondary | ICD-10-CM

## 2017-08-28 DIAGNOSIS — I10 Essential (primary) hypertension: Secondary | ICD-10-CM | POA: Diagnosis not present

## 2017-08-28 DIAGNOSIS — H35033 Hypertensive retinopathy, bilateral: Secondary | ICD-10-CM

## 2017-09-01 ENCOUNTER — Encounter: Payer: Self-pay | Admitting: Physician Assistant

## 2017-09-01 ENCOUNTER — Ambulatory Visit (HOSPITAL_COMMUNITY)
Admission: RE | Admit: 2017-09-01 | Discharge: 2017-09-01 | Disposition: A | Discharge status: Home / Self Care | Payer: Medicare HMO | Source: Ambulatory Visit | Attending: Physician Assistant | Admitting: Physician Assistant

## 2017-09-01 ENCOUNTER — Telehealth: Payer: Self-pay | Admitting: *Deleted

## 2017-09-01 DIAGNOSIS — I6523 Occlusion and stenosis of bilateral carotid arteries: Secondary | ICD-10-CM | POA: Diagnosis not present

## 2017-09-01 DIAGNOSIS — I422 Other hypertrophic cardiomyopathy: Secondary | ICD-10-CM | POA: Diagnosis present

## 2017-09-01 DIAGNOSIS — I739 Peripheral vascular disease, unspecified: Secondary | ICD-10-CM | POA: Diagnosis present

## 2017-09-01 NOTE — Telephone Encounter (Signed)
-----   Message from Scott T Weaver, PA-C sent at 09/01/2017  2:26 PM EDT ----- Please call the patient. Carotid ultrasound demonstrates stable, mild bilateral plaque. Repeat carotid US in 1 year. Continue current medications. Please fax a copy of this study result to his PCP:  Eason, Paul, MD  Thanks! Scott Weaver, PA-C    09/01/2017 2:25 PM   

## 2017-09-01 NOTE — Telephone Encounter (Signed)
-----   Message from Beatrice Lecher, [place] sent at 09/01/2017  2:26 PM EDT ----- Please call the patient. Carotid ultrasound demonstrates stable, mild bilateral plaque. Repeat carotid US in 1 year. Continue current medications. Please fax a copy of this study result to his PCP:  Kathlee Nations, MD  Thanks! Tereso Newcomer, PA-C    09/01/2017 2:25 PM

## 2017-09-01 NOTE — Telephone Encounter (Signed)
Lmtcb to go over Carotid  Results.

## 2017-09-01 NOTE — Telephone Encounter (Signed)
Pt has been notified of both UE Arterial results and Carotids by phone with verbal understanding. Pt thanked me for my call today.

## 2017-09-25 ENCOUNTER — Encounter (INDEPENDENT_AMBULATORY_CARE_PROVIDER_SITE_OTHER): Payer: Medicare HMO | Admitting: Ophthalmology

## 2017-09-25 DIAGNOSIS — H35033 Hypertensive retinopathy, bilateral: Secondary | ICD-10-CM

## 2017-09-25 DIAGNOSIS — I10 Essential (primary) hypertension: Secondary | ICD-10-CM

## 2017-09-25 DIAGNOSIS — H43813 Vitreous degeneration, bilateral: Secondary | ICD-10-CM | POA: Diagnosis not present

## 2017-09-25 DIAGNOSIS — H34831 Tributary (branch) retinal vein occlusion, right eye, with macular edema: Secondary | ICD-10-CM

## 2017-10-09 ENCOUNTER — Other Ambulatory Visit: Payer: Self-pay | Admitting: Cardiovascular Disease

## 2017-10-19 ENCOUNTER — Encounter (INDEPENDENT_AMBULATORY_CARE_PROVIDER_SITE_OTHER): Payer: Medicare HMO | Admitting: Ophthalmology

## 2017-10-19 ENCOUNTER — Telehealth: Payer: Self-pay

## 2017-10-19 DIAGNOSIS — H34831 Tributary (branch) retinal vein occlusion, right eye, with macular edema: Secondary | ICD-10-CM

## 2017-10-19 DIAGNOSIS — H43813 Vitreous degeneration, bilateral: Secondary | ICD-10-CM | POA: Diagnosis not present

## 2017-10-19 DIAGNOSIS — H35033 Hypertensive retinopathy, bilateral: Secondary | ICD-10-CM

## 2017-10-19 DIAGNOSIS — H2512 Age-related nuclear cataract, left eye: Secondary | ICD-10-CM | POA: Diagnosis not present

## 2017-10-19 DIAGNOSIS — I10 Essential (primary) hypertension: Secondary | ICD-10-CM | POA: Diagnosis not present

## 2017-10-19 NOTE — Telephone Encounter (Signed)
   [facility] Pre-operative Risk Assessment    Request for surgical clearance:  1. What type of surgery is being performed? Ophthalmic Surgery    2. When is this surgery scheduled? TBD   3. Are there any medications that need to be held prior to surgery and how long? Blood thinners 5 days prior to Retina Surgery   4. Practice name and name of physician performing surgery? [facility] (Dr. Tempie Hoist)  5. What is your office phone and fax number?  1. Phone: [phone] 2. Fax: [phone]   6. Anesthesia type (None, local, MAC, general) ? General

## 2017-10-20 ENCOUNTER — Encounter: Payer: Self-pay | Admitting: Physician Assistant

## 2017-10-20 NOTE — Telephone Encounter (Signed)
    Chart reviewed as part of pre-operative protocol coverage. Patient was contacted 10/20/2017 in reference to pre-operative risk assessment for pending surgery as outlined below.  David Pacheco was last seen on 02/14/2017 by David NewcomerScott Weaver, PA-C.  Since that day, David Pacheco has done well.  He does not feel limited in his activity chest pain or shortness of breath.  He has no other concerns besides getting his eye fixed.  His PCI to his left SFA was in 2015 so I feel he is at acceptable risk to hold his Plavix for 5 days.  Therefore, based on ACC/AHA guidelines, the patient would be at acceptable risk for the planned procedure without further cardiovascular testing.   I will route this recommendation to the requesting party via Epic fax function and remove from pre-op pool.  Please call with questions.  David Demarkhonda Barrett, PA-C 10/20/2017, 2:06 PM

## 2017-10-20 NOTE — Telephone Encounter (Signed)
Clearance routed via Epic fax to requesting surgeon 

## 2017-11-16 ENCOUNTER — Encounter (INDEPENDENT_AMBULATORY_CARE_PROVIDER_SITE_OTHER): Payer: Medicare HMO | Admitting: Ophthalmology

## 2017-11-16 DIAGNOSIS — H34831 Tributary (branch) retinal vein occlusion, right eye, with macular edema: Secondary | ICD-10-CM

## 2017-11-16 DIAGNOSIS — I10 Essential (primary) hypertension: Secondary | ICD-10-CM | POA: Diagnosis not present

## 2017-11-16 DIAGNOSIS — H26102 Unspecified traumatic cataract, left eye: Secondary | ICD-10-CM | POA: Diagnosis present

## 2017-11-16 DIAGNOSIS — H43813 Vitreous degeneration, bilateral: Secondary | ICD-10-CM

## 2017-11-16 DIAGNOSIS — H35033 Hypertensive retinopathy, bilateral: Secondary | ICD-10-CM

## 2017-11-16 DIAGNOSIS — H26222 Cataract secondary to ocular disorders (degenerative) (inflammatory), left eye: Secondary | ICD-10-CM | POA: Diagnosis present
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
## 2017-12-11 ENCOUNTER — Encounter (HOSPITAL_COMMUNITY): Payer: Self-pay | Admitting: Emergency Medicine

## 2017-12-11 ENCOUNTER — Other Ambulatory Visit: Payer: Self-pay

## 2017-12-11 NOTE — Progress Notes (Signed)
Mr David Pacheco states that last day he took Plavix was last Monday.  Patient reported that Dr Ashley RoyaltyMatthews instructed him to arrive at 09:00 AM at the hospital and not take any medications.

## 2017-12-12 ENCOUNTER — Ambulatory Visit (HOSPITAL_COMMUNITY)
Admission: RE | Admit: 2017-12-12 | Discharge: 2017-12-13 | Disposition: A | Discharge status: Home / Self Care | Payer: Medicare HMO | Source: Ambulatory Visit | Attending: Ophthalmology | Admitting: Ophthalmology

## 2017-12-12 ENCOUNTER — Encounter (HOSPITAL_COMMUNITY): Payer: Self-pay | Admitting: Surgery

## 2017-12-12 ENCOUNTER — Ambulatory Visit (HOSPITAL_COMMUNITY): Payer: Medicare HMO | Admitting: Emergency Medicine

## 2017-12-12 ENCOUNTER — Encounter (HOSPITAL_COMMUNITY)
Admission: RE | Disposition: A | Discharge status: Home / Self Care | Payer: Self-pay | Source: Ambulatory Visit | Attending: Ophthalmology

## 2017-12-12 DIAGNOSIS — I10 Essential (primary) hypertension: Secondary | ICD-10-CM | POA: Insufficient documentation

## 2017-12-12 DIAGNOSIS — H43393 Other vitreous opacities, bilateral: Secondary | ICD-10-CM | POA: Insufficient documentation

## 2017-12-12 DIAGNOSIS — H3500 Unspecified background retinopathy: Secondary | ICD-10-CM | POA: Diagnosis not present

## 2017-12-12 DIAGNOSIS — H26102 Unspecified traumatic cataract, left eye: Secondary | ICD-10-CM | POA: Diagnosis present

## 2017-12-12 DIAGNOSIS — F17210 Nicotine dependence, cigarettes, uncomplicated: Secondary | ICD-10-CM | POA: Insufficient documentation

## 2017-12-12 DIAGNOSIS — H26132 Total traumatic cataract, left eye: Secondary | ICD-10-CM | POA: Diagnosis present

## 2017-12-12 DIAGNOSIS — H26222 Cataract secondary to ocular disorders (degenerative) (inflammatory), left eye: Secondary | ICD-10-CM | POA: Diagnosis present

## 2017-12-12 DIAGNOSIS — Z79899 Other long term (current) drug therapy: Secondary | ICD-10-CM | POA: Diagnosis not present

## 2017-12-12 DIAGNOSIS — Z7902 Long term (current) use of antithrombotics/antiplatelets: Secondary | ICD-10-CM | POA: Diagnosis not present

## 2017-12-12 DIAGNOSIS — H26139 Total traumatic cataract, unspecified eye: Secondary | ICD-10-CM | POA: Diagnosis present

## 2017-12-12 DIAGNOSIS — H27132 Posterior dislocation of lens, left eye: Secondary | ICD-10-CM | POA: Diagnosis not present

## 2017-12-12 HISTORY — PX: LASER PHOTO ABLATION: SHX5942

## 2017-12-12 HISTORY — DX: Other hypertrophic cardiomyopathy: I42.2

## 2017-12-12 HISTORY — DX: Acute embolism and thrombosis of unspecified deep veins of unspecified lower extremity: I82.409

## 2017-12-12 HISTORY — PX: PARS PLANA VITRECTOMY: SHX2166

## 2017-12-12 LAB — BASIC METABOLIC PANEL
Anion gap: 10 (ref 5–15)
BUN: 8 mg/dL (ref 6–20)
CALCIUM: 8.9 mg/dL (ref 8.9–10.3)
CHLORIDE: 110 mmol/L (ref 101–111)
CO2: 21 mmol/L — AB (ref 22–32)
CREATININE: 1 mg/dL (ref 0.61–1.24)
GFR calc Af Amer: >60 mL/min (ref >60)
GFR calc non Af Amer: >60 mL/min (ref >60)
GLUCOSE: 88 mg/dL (ref 65–99)
Potassium: 3.7 mmol/L (ref 3.5–5.1)
Sodium: 141 mmol/L (ref 135–145)

## 2017-12-12 SURGERY — PARS PLANA VITRECTOMY WITH 25G REMOVAL/SUTURE INTRAOCULAR LENS
Anesthesia: General | Site: Eye | Laterality: Left

## 2017-12-12 MED ORDER — OXYCODONE HCL 5 MG PO TABS: 5.0000 mg | ORAL_TABLET | Freq: Once | ORAL | Status: DC | PRN

## 2017-12-12 MED ORDER — TEMAZEPAM 15 MG PO CAPS: 15.0000 mg | ORAL_CAPSULE | Freq: Every evening | ORAL | Status: DC | PRN

## 2017-12-12 MED ORDER — MAGNESIUM HYDROXIDE 400 MG/5ML PO SUSP: 15.0000 mL | Freq: Four times a day (QID) | ORAL | Status: DC | PRN

## 2017-12-12 MED ORDER — BSS IO SOLN
INTRAOCULAR | Status: DC | PRN
  Administered 2017-12-12: 15 mL via INTRAOCULAR

## 2017-12-12 MED ORDER — CARVEDILOL 25 MG PO TABS
25.0000 mg | ORAL_TABLET | Freq: Two times a day (BID) | ORAL | Status: DC
  Administered 2017-12-12 – 2017-12-13 (×2): 25 mg via ORAL
  Filled 2017-12-12 (×2): qty 1

## 2017-12-12 MED ORDER — POLYMYXIN B SULFATE 500000 UNITS IJ SOLR
INTRAMUSCULAR | Status: AC
  Filled 2017-12-12: qty 500000

## 2017-12-12 MED ORDER — ROCURONIUM BROMIDE 10 MG/ML (PF) SYRINGE
PREFILLED_SYRINGE | INTRAVENOUS | Status: AC
  Filled 2017-12-12: qty 5

## 2017-12-12 MED ORDER — ONDANSETRON HCL 4 MG/2ML IJ SOLN
4.0000 mg | Freq: Four times a day (QID) | INTRAMUSCULAR | Status: DC
  Administered 2017-12-12: 4 mg via INTRAVENOUS
  Filled 2017-12-12: qty 2

## 2017-12-12 MED ORDER — STERILE WATER FOR INJECTION IJ SOLN
INTRAMUSCULAR | Status: DC | PRN
  Administered 2017-12-12: 20 mL

## 2017-12-12 MED ORDER — FENTANYL CITRATE (PF) 100 MCG/2ML IJ SOLN
INTRAMUSCULAR | Status: DC | PRN
  Administered 2017-12-12 (×3): 50 ug via INTRAVENOUS

## 2017-12-12 MED ORDER — EPINEPHRINE PF 1 MG/ML IJ SOLN
INTRAOCULAR | Status: DC | PRN
  Administered 2017-12-12: 12:00:00

## 2017-12-12 MED ORDER — BRIMONIDINE TARTRATE 0.2 % OP SOLN
1.0000 [drp] | Freq: Two times a day (BID) | OPHTHALMIC | Status: DC
  Filled 2017-12-12: qty 5

## 2017-12-12 MED ORDER — BUPIVACAINE HCL (PF) 0.75 % IJ SOLN
INTRAMUSCULAR | Status: AC
  Filled 2017-12-12: qty 10

## 2017-12-12 MED ORDER — SODIUM HYALURONATE 10 MG/ML IO SOLN
INTRAOCULAR | Status: AC
  Filled 2017-12-12: qty 0.85

## 2017-12-12 MED ORDER — DORZOLAMIDE HCL 2 % OP SOLN
1.0000 [drp] | Freq: Three times a day (TID) | OPHTHALMIC | Status: DC
  Filled 2017-12-12: qty 10

## 2017-12-12 MED ORDER — AMLODIPINE BESYLATE 10 MG PO TABS
10.0000 mg | ORAL_TABLET | Freq: Every day | ORAL | Status: DC
  Administered 2017-12-12: 10 mg via ORAL
  Filled 2017-12-12: qty 1

## 2017-12-12 MED ORDER — SODIUM CHLORIDE 0.9 % IV SOLN
INTRAVENOUS | Status: DC
  Administered 2017-12-12 (×2): via INTRAVENOUS

## 2017-12-12 MED ORDER — SUGAMMADEX SODIUM 200 MG/2ML IV SOLN
INTRAVENOUS | Status: AC
Start: 2017-12-12 — End: 2017-12-12
  Filled 2017-12-12: qty 2

## 2017-12-12 MED ORDER — CEFTAZIDIME 1 G IJ SOLR
INTRAMUSCULAR | Status: AC
  Filled 2017-12-12: qty 1

## 2017-12-12 MED ORDER — SUGAMMADEX SODIUM 200 MG/2ML IV SOLN
INTRAVENOUS | Status: DC | PRN
  Administered 2017-12-12: 150 mg via INTRAVENOUS

## 2017-12-12 MED ORDER — CEFAZOLIN SODIUM-DEXTROSE 2-4 GM/100ML-% IV SOLN
2.0000 g | INTRAVENOUS | Status: AC
  Administered 2017-12-12: 2 g via INTRAVENOUS
  Filled 2017-12-12: qty 100

## 2017-12-12 MED ORDER — PROPOFOL 10 MG/ML IV BOLUS
INTRAVENOUS | Status: AC
  Filled 2017-12-12: qty 20

## 2017-12-12 MED ORDER — PROPOFOL 10 MG/ML IV BOLUS
INTRAVENOUS | Status: DC | PRN
  Administered 2017-12-12: 150 mg via INTRAVENOUS

## 2017-12-12 MED ORDER — LIDOCAINE 2% (20 MG/ML) 5 ML SYRINGE
INTRAMUSCULAR | Status: DC | PRN
  Administered 2017-12-12: 40 mg via INTRAVENOUS

## 2017-12-12 MED ORDER — BACITRACIN-POLYMYXIN B 500-10000 UNIT/GM OP OINT
1.0000 "application " | TOPICAL_OINTMENT | Freq: Three times a day (TID) | OPHTHALMIC | Status: DC
  Filled 2017-12-12: qty 3.5

## 2017-12-12 MED ORDER — STERILE WATER FOR INJECTION IJ SOLN
INTRAMUSCULAR | Status: DC | PRN
  Administered 2017-12-12: 200 mL

## 2017-12-12 MED ORDER — ATROPINE SULFATE 1 % OP SOLN
OPHTHALMIC | Status: AC
  Filled 2017-12-12: qty 5

## 2017-12-12 MED ORDER — GATIFLOXACIN 0.5 % OP SOLN
1.0000 [drp] | Freq: Four times a day (QID) | OPHTHALMIC | Status: DC
  Filled 2017-12-12: qty 2.5

## 2017-12-12 MED ORDER — PREDNISOLONE ACETATE 1 % OP SUSP
1.0000 [drp] | Freq: Four times a day (QID) | OPHTHALMIC | Status: DC
  Filled 2017-12-12: qty 5

## 2017-12-12 MED ORDER — 0.9 % SODIUM CHLORIDE (POUR BTL) OPTIME
TOPICAL | Status: DC | PRN
  Administered 2017-12-12: 1000 mL

## 2017-12-12 MED ORDER — DEXAMETHASONE SODIUM PHOSPHATE 10 MG/ML IJ SOLN
INTRAMUSCULAR | Status: AC
  Filled 2017-12-12: qty 1

## 2017-12-12 MED ORDER — TROPICAMIDE 1 % OP SOLN
1.0000 [drp] | OPHTHALMIC | Status: AC | PRN
  Administered 2017-12-12 (×2): 1 [drp] via OPHTHALMIC
  Filled 2017-12-12: qty 15

## 2017-12-12 MED ORDER — FENTANYL CITRATE (PF) 250 MCG/5ML IJ SOLN
INTRAMUSCULAR | Status: AC
  Filled 2017-12-12: qty 5

## 2017-12-12 MED ORDER — BACITRACIN-POLYMYXIN B 500-10000 UNIT/GM OP OINT
TOPICAL_OINTMENT | OPHTHALMIC | Status: DC | PRN
  Administered 2017-12-12: 1 via OPHTHALMIC

## 2017-12-12 MED ORDER — LIDOCAINE HCL 2 % IJ SOLN
INTRAMUSCULAR | Status: AC
  Filled 2017-12-12: qty 20

## 2017-12-12 MED ORDER — HYDROCODONE-ACETAMINOPHEN 5-325 MG PO TABS: 1.0000 | ORAL_TABLET | ORAL | Status: DC | PRN

## 2017-12-12 MED ORDER — LATANOPROST 0.005 % OP SOLN
1.0000 [drp] | OPHTHALMIC | Status: DC
  Filled 2017-12-12 (×2): qty 2.5

## 2017-12-12 MED ORDER — HYALURONIDASE HUMAN 150 UNIT/ML IJ SOLN
INTRAMUSCULAR | Status: AC
  Filled 2017-12-12: qty 1

## 2017-12-12 MED ORDER — ROCURONIUM BROMIDE 50 MG/5ML IV SOSY
PREFILLED_SYRINGE | INTRAVENOUS | Status: DC | PRN
  Administered 2017-12-12: 50 mg via INTRAVENOUS
  Administered 2017-12-12: 10 mg via INTRAVENOUS

## 2017-12-12 MED ORDER — SODIUM CHLORIDE 0.45 % IV SOLN
INTRAVENOUS | Status: DC
  Administered 2017-12-12: 15:00:00 via INTRAVENOUS

## 2017-12-12 MED ORDER — DEXAMETHASONE SODIUM PHOSPHATE 10 MG/ML IJ SOLN
INTRAMUSCULAR | Status: DC | PRN
  Administered 2017-12-12: 10 mg via INTRAVENOUS

## 2017-12-12 MED ORDER — CYCLOPENTOLATE HCL 1 % OP SOLN
1.0000 [drp] | OPHTHALMIC | Status: AC | PRN
  Administered 2017-12-12 (×2): 1 [drp] via OPHTHALMIC
  Filled 2017-12-12: qty 2

## 2017-12-12 MED ORDER — SODIUM CHLORIDE 0.9 % IJ SOLN
INTRAMUSCULAR | Status: AC
  Filled 2017-12-12: qty 10

## 2017-12-12 MED ORDER — MORPHINE SULFATE (PF) 4 MG/ML IV SOLN: 1.0000 mg | INTRAVENOUS | Status: DC | PRN

## 2017-12-12 MED ORDER — LISINOPRIL 5 MG PO TABS
2.5000 mg | ORAL_TABLET | Freq: Every day | ORAL | Status: DC
  Administered 2017-12-12: 2.5 mg via ORAL
  Filled 2017-12-12: qty 1

## 2017-12-12 MED ORDER — DEXAMETHASONE SODIUM PHOSPHATE 10 MG/ML IJ SOLN
INTRAMUSCULAR | Status: AC
Start: 2017-12-12 — End: 2017-12-12
  Filled 2017-12-12: qty 1

## 2017-12-12 MED ORDER — LIDOCAINE 2% (20 MG/ML) 5 ML SYRINGE
INTRAMUSCULAR | Status: AC
  Filled 2017-12-12: qty 5

## 2017-12-12 MED ORDER — HYALURONIDASE HUMAN 150 UNIT/ML IJ SOLN
INTRAMUSCULAR | Status: AC
Start: 2017-12-12 — End: 2017-12-12
  Filled 2017-12-12: qty 1

## 2017-12-12 MED ORDER — ACETAMINOPHEN 325 MG PO TABS: 325.0000 mg | ORAL_TABLET | ORAL | Status: DC | PRN

## 2017-12-12 MED ORDER — TRIAMCINOLONE ACETONIDE 40 MG/ML IJ SUSP
INTRAMUSCULAR | Status: AC
  Filled 2017-12-12: qty 5

## 2017-12-12 MED ORDER — BACITRACIN-POLYMYXIN B 500-10000 UNIT/GM OP OINT
TOPICAL_OINTMENT | OPHTHALMIC | Status: AC
  Filled 2017-12-12: qty 3.5

## 2017-12-12 MED ORDER — SUGAMMADEX SODIUM 200 MG/2ML IV SOLN
INTRAVENOUS | Status: AC
  Filled 2017-12-12: qty 2

## 2017-12-12 MED ORDER — DEXAMETHASONE SODIUM PHOSPHATE 10 MG/ML IJ SOLN
INTRAMUSCULAR | Status: DC | PRN
  Administered 2017-12-12: 10 mg

## 2017-12-12 MED ORDER — OXYCODONE HCL 5 MG/5ML PO SOLN: 5.0000 mg | Freq: Once | ORAL | Status: DC | PRN

## 2017-12-12 MED ORDER — BSS PLUS IO SOLN
INTRAOCULAR | Status: AC
Start: 2017-12-12 — End: 2017-12-12
  Filled 2017-12-12: qty 500

## 2017-12-12 MED ORDER — SODIUM HYALURONATE 10 MG/ML IO SOLN
INTRAOCULAR | Status: DC | PRN
  Administered 2017-12-12: 0.85 mL via INTRAOCULAR

## 2017-12-12 MED ORDER — HEMOSTATIC AGENTS (NO CHARGE) OPTIME
TOPICAL | Status: DC | PRN
  Administered 2017-12-12: 1 via TOPICAL

## 2017-12-12 MED ORDER — BUPIVACAINE HCL (PF) 0.75 % IJ SOLN
INTRAMUSCULAR | Status: DC | PRN
  Administered 2017-12-12: 10 mL

## 2017-12-12 MED ORDER — BSS IO SOLN
INTRAOCULAR | Status: AC
  Filled 2017-12-12: qty 15

## 2017-12-12 MED ORDER — STERILE WATER FOR INJECTION IJ SOLN
INTRAMUSCULAR | Status: AC
  Filled 2017-12-12: qty 20

## 2017-12-12 MED ORDER — EPHEDRINE SULFATE-NACL 50-0.9 MG/10ML-% IV SOSY
PREFILLED_SYRINGE | INTRAVENOUS | Status: DC | PRN
  Administered 2017-12-12 (×2): 10 mg via INTRAVENOUS

## 2017-12-12 MED ORDER — PHENYLEPHRINE HCL 2.5 % OP SOLN
1.0000 [drp] | OPHTHALMIC | Status: AC | PRN
  Administered 2017-12-12 (×2): 1 [drp] via OPHTHALMIC
  Filled 2017-12-12: qty 2

## 2017-12-12 MED ORDER — ONDANSETRON HCL 4 MG/2ML IJ SOLN: 4.0000 mg | Freq: Once | INTRAMUSCULAR | Status: DC | PRN

## 2017-12-12 MED ORDER — FENTANYL CITRATE (PF) 100 MCG/2ML IJ SOLN: 25.0000 ug | INTRAMUSCULAR | Status: DC | PRN

## 2017-12-12 MED ORDER — LATANOPROST 0.005 % OP SOLN
1.0000 [drp] | Freq: Every day | OPHTHALMIC | Status: DC
  Filled 2017-12-12: qty 2.5

## 2017-12-12 MED ORDER — GATIFLOXACIN 0.5 % OP SOLN
1.0000 [drp] | OPHTHALMIC | Status: AC | PRN
  Administered 2017-12-12 (×2): 1 [drp] via OPHTHALMIC
  Filled 2017-12-12: qty 2.5

## 2017-12-12 MED ORDER — EPHEDRINE 5 MG/ML INJ
INTRAVENOUS | Status: AC
Start: 2017-12-12 — End: 2017-12-12
  Filled 2017-12-12: qty 10

## 2017-12-12 MED ORDER — ACETAZOLAMIDE SODIUM 500 MG IJ SOLR
500.0000 mg | Freq: Once | INTRAMUSCULAR | Status: AC
  Administered 2017-12-13: 500 mg via INTRAVENOUS
  Filled 2017-12-12: qty 500

## 2017-12-12 MED ORDER — EPINEPHRINE PF 1 MG/ML IJ SOLN
INTRAMUSCULAR | Status: AC
Start: 2017-12-12 — End: 2017-12-12
  Filled 2017-12-12: qty 1

## 2017-12-12 MED ORDER — CLOPIDOGREL BISULFATE 75 MG PO TABS: 75.0000 mg | ORAL_TABLET | Freq: Every day | ORAL | Status: DC

## 2017-12-12 MED ORDER — HYPROMELLOSE (GONIOSCOPIC) 2.5 % OP SOLN
OPHTHALMIC | Status: AC
Start: 2017-12-12 — End: 2017-12-12
  Filled 2017-12-12: qty 15

## 2017-12-12 MED ORDER — BUPIVACAINE-EPINEPHRINE (PF) 0.25% -1:200000 IJ SOLN
INTRAMUSCULAR | Status: AC
Start: 2017-12-12 — End: 2017-12-12
  Filled 2017-12-12: qty 30

## 2017-12-12 MED ORDER — ACETAZOLAMIDE SODIUM 500 MG IJ SOLR
INTRAMUSCULAR | Status: AC
  Filled 2017-12-12: qty 500

## 2017-12-12 MED ORDER — GLYCOPYRROLATE 0.2 MG/ML IJ SOLN
INTRAMUSCULAR | Status: DC | PRN
  Administered 2017-12-12: 0.2 mg via INTRAVENOUS

## 2017-12-12 MED ORDER — TETRACAINE HCL 0.5 % OP SOLN
2.0000 [drp] | Freq: Once | OPHTHALMIC | Status: DC
  Filled 2017-12-12: qty 4

## 2017-12-12 SURGICAL SUPPLY — 66 items
APPLICATOR DR MATTHEWS STRL (MISCELLANEOUS) IMPLANT
BLADE EYE CATARACT 19 1.4 BEAV (BLADE) IMPLANT
BLADE KERATOME 2.75 (BLADE) ×3 IMPLANT
BLADE KERATOME 2.75MM (BLADE) ×1
CANNULA VLV SOFT TIP 25GA (OPHTHALMIC) ×4 IMPLANT
CORDS BIPOLAR (ELECTRODE) ×4 IMPLANT
COTTONBALL LRG STERILE PKG (GAUZE/BANDAGES/DRESSINGS) ×12 IMPLANT
COVER MAYO STAND STRL (DRAPES) ×4 IMPLANT
DRAPE INCISE 51X51 W/FILM STRL (DRAPES) IMPLANT
DRAPE OPHTHALMIC 77X100 STRL (CUSTOM PROCEDURE TRAY) ×4 IMPLANT
FILTER BLUE MILLIPORE (MISCELLANEOUS) IMPLANT
FORCEPS ECKARDT ILM 25G SERR (OPHTHALMIC RELATED) IMPLANT
FORCEPS GRIESHABER ILM 25G A (INSTRUMENTS) ×4 IMPLANT
FORCEPS HORIZONTAL 25G DISP (OPHTHALMIC RELATED) IMPLANT
GAS OPHTHALMIC (MISCELLANEOUS) IMPLANT
GLOVE BIO SURGEON STRL SZ 6.5 (GLOVE) ×3 IMPLANT
GLOVE BIO SURGEONS STRL SZ 6.5 (GLOVE) ×1
GLOVE BIOGEL PI IND STRL 6.5 (GLOVE) ×2 IMPLANT
GLOVE BIOGEL PI INDICATOR 6.5 (GLOVE) ×2
GLOVE SS BIOGEL STRL SZ 6.5 (GLOVE) ×4 IMPLANT
GLOVE SS BIOGEL STRL SZ 7 (GLOVE) ×2 IMPLANT
GLOVE SUPERSENSE BIOGEL SZ 6.5 (GLOVE) ×4
GLOVE SUPERSENSE BIOGEL SZ 7 (GLOVE) ×2
GLOVE SURG 8.5 LATEX PF (GLOVE) ×4 IMPLANT
GOWN STRL REUS W/ TWL LRG LVL3 (GOWN DISPOSABLE) ×6 IMPLANT
GOWN STRL REUS W/TWL LRG LVL3 (GOWN DISPOSABLE) ×6
HANDLE PNEUMATIC FOR CONSTEL (OPHTHALMIC) ×4 IMPLANT
KIT BASIN OR (CUSTOM PROCEDURE TRAY) ×4 IMPLANT
KIT ROOM TURNOVER OR (KITS) ×4 IMPLANT
KNIFE CRESCENT 1.75 EDGEAHEAD (BLADE) IMPLANT
LENS IOL POST 1PIECE DIOP 19.5 ×4 IMPLANT
MICROPICK 25G (MISCELLANEOUS)
NEEDLE 18GX1X1/2 (RX/OR ONLY) (NEEDLE) ×4 IMPLANT
NEEDLE 25GX 5/8IN NON SAFETY (NEEDLE) ×4 IMPLANT
NEEDLE 27GX1/2 REG BEVEL ECLIP (NEEDLE) IMPLANT
NEEDLE FILTER BLUNT 18X 1/2SAF (NEEDLE) ×2
NEEDLE FILTER BLUNT 18X1 1/2 (NEEDLE) ×2 IMPLANT
NEEDLE HYPO 30X.5 LL (NEEDLE) ×4 IMPLANT
NS IRRIG 1000ML POUR BTL (IV SOLUTION) ×4 IMPLANT
PACK VITRECTOMY CUSTOM (CUSTOM PROCEDURE TRAY) ×4 IMPLANT
PAD ARMBOARD 7.5X6 YLW CONV (MISCELLANEOUS) ×8 IMPLANT
PAK PIK VITRECTOMY CVS 25GA (OPHTHALMIC) ×4 IMPLANT
PENCIL BIPOLAR 25GA STR DISP (OPHTHALMIC RELATED) IMPLANT
PIC ILLUMINATED 25G (OPHTHALMIC) ×4
PICK MICROPICK 25G (MISCELLANEOUS) IMPLANT
PIK ILLUMINATED 25G (OPHTHALMIC) ×2 IMPLANT
PROBE LASER ILLUM FLEX CVD 25G (OPHTHALMIC) ×4 IMPLANT
ROLLS DENTAL (MISCELLANEOUS) ×8 IMPLANT
SCRAPER DIAMOND 25GA (OPHTHALMIC RELATED) IMPLANT
SPONGE SURGIFOAM ABS GEL 12-7 (HEMOSTASIS) ×4 IMPLANT
STOPCOCK 4 WAY LG BORE MALE ST (IV SETS) IMPLANT
SUT CHROMIC 7 0 TG140 8 (SUTURE) ×4 IMPLANT
SUT ETHILON 10 0 CS140 6 (SUTURE) ×8 IMPLANT
SUT ETHILON 9 0 TG140 8 (SUTURE) IMPLANT
SUT POLY NON ABSORB 10-0 8 STR (SUTURE) ×12 IMPLANT
SUT SILK 4 0 RB 1 (SUTURE) IMPLANT
SUT VICRYL 7 0 TG140 8 (SUTURE) ×4 IMPLANT
SYR 10ML LL (SYRINGE) IMPLANT
SYR 20CC LL (SYRINGE) ×4 IMPLANT
SYR 5ML LL (SYRINGE) IMPLANT
SYR BULB 3OZ (MISCELLANEOUS) ×4 IMPLANT
SYR TB 1ML LUER SLIP (SYRINGE) ×4 IMPLANT
TAPE SURG TRANSPORE 1 IN (GAUZE/BANDAGES/DRESSINGS) ×2 IMPLANT
TAPE SURGICAL TRANSPORE 1 IN (GAUZE/BANDAGES/DRESSINGS) ×2
TUBING HIGH PRESS EXTEN 6IN (TUBING) IMPLANT
WATER STERILE IRR 1000ML POUR (IV SOLUTION) ×4 IMPLANT

## 2017-12-12 NOTE — Anesthesia Preprocedure Evaluation (Addendum)
Anesthesia Evaluation  Patient identified by MRN, date of birth, ID band Patient awake    Reviewed: Allergy & Precautions, NPO status , Patient's Chart, lab work & pertinent test results  Airway Mallampati: II  TM Distance: >3 FB Neck ROM: Full    Dental  (+) Edentulous Upper, Edentulous Lower   Pulmonary Current Smoker,    breath sounds clear to auscultation       Cardiovascular hypertension,  Rhythm:Regular Rate:Normal     Neuro/Psych    GI/Hepatic   Endo/Other    Renal/GU      Musculoskeletal   Abdominal   Peds  Hematology   Anesthesia Other Findings   Reproductive/Obstetrics                            Anesthesia Physical Anesthesia Plan  ASA: III  Anesthesia Plan: General   Post-op Pain Management:    Induction: Intravenous  PONV Risk Score and Plan: Ondansetron and Dexamethasone  Airway Management Planned: Oral ETT  Additional Equipment:   Intra-op Plan:   Post-operative Plan: Extubation in OR  Informed Consent: I have reviewed the patients History and Physical, chart, labs and discussed the procedure including the risks, benefits and alternatives for the proposed anesthesia with the patient or authorized representative who has indicated his/her understanding and acceptance.   Dental advisory given  Plan Discussed with: CRNA and Anesthesiologist  Anesthesia Plan Comments:         Anesthesia Quick Evaluation  

## 2017-12-12 NOTE — Anesthesia Postprocedure Evaluation (Signed)
Anesthesia Post Note  Patient: David MeigsLeon P Pacheco  Procedure(s) Performed: PARS PLANA VITRECTOMY WITH 25G REMOVAL/SUTURE INTRAOCULAR LENS (Left ) LASER PHOTO ABLATION (Left Eye)     Patient location during evaluation: PACU Anesthesia Type: General Level of consciousness: awake, awake and alert and oriented Pain management: pain level controlled Vital Signs Assessment: post-procedure vital signs reviewed and stable Respiratory status: spontaneous breathing, nonlabored ventilation, respiratory function stable and patient connected to nasal cannula oxygen Cardiovascular status: blood pressure returned to baseline and stable Postop Assessment: no apparent nausea or vomiting Anesthetic complications: no    Last Vitals:  Vitals:   12/12/17 1448 12/12/17 1509  BP:  (!) 112/45  Pulse:  (!) 50  Resp:    Temp: 36.5 C 36.4 C  SpO2:  94%    Last Pain:  Vitals:   12/12/17 1509  TempSrc: Oral  PainSc: 1                  David Pacheco

## 2017-12-12 NOTE — Anesthesia Procedure Notes (Signed)
Procedure Name: Intubation Date/Time: 12/12/2017 11:49 AM Performed by: Pearson Grippeobertson, Genoveva Singleton M, CRNA Pre-anesthesia Checklist: Patient identified, Emergency Drugs available, Suction available and Patient being monitored Patient Re-evaluated:Patient Re-evaluated prior to induction Oxygen Delivery Method: Circle system utilized Preoxygenation: Pre-oxygenation with 100% oxygen Induction Type: IV induction Ventilation: Mask ventilation without difficulty Laryngoscope Size: Miller and 2 Grade View: Grade I Tube type: Oral Tube size: 7.5 mm Number of attempts: 1 Airway Equipment and Method: Stylet and Oral airway Placement Confirmation: ETT inserted through vocal cords under direct vision,  positive ETCO2 and breath sounds checked- equal and bilateral Secured at: 21 cm Tube secured with: Tape Dental Injury: Teeth and Oropharynx as per pre-operative assessment

## 2017-12-12 NOTE — Transfer of Care (Signed)
Immediate Anesthesia Transfer of Care Note  Patient: David Pacheco  Procedure(s) Performed: PARS PLANA VITRECTOMY WITH 25G REMOVAL/SUTURE INTRAOCULAR LENS (Left ) LASER PHOTO ABLATION (Left Eye)  Patient Location: PACU  Anesthesia Type:General  Level of Consciousness: awake, alert  and oriented  Airway & Oxygen Therapy: Patient Spontanous Breathing and Patient connected to face mask oxygen  Post-op Assessment: Report given to RN and Post -op Vital signs reviewed and stable  Post vital signs: Reviewed and stable  Last Vitals:  Vitals:   12/12/17 0921  BP: (!) 185/71  Pulse: (!) 51  Resp: 18  Temp: 36.7 C  SpO2: 97%    Last Pain:  Vitals:   12/12/17 0921  TempSrc: Oral      Patients Stated Pain Goal: 3 (12/12/17 0925)  Complications: No apparent anesthesia complications

## 2017-12-12 NOTE — H&P (Signed)
I examined the patient today and there is no change in the medical status 

## 2017-12-12 NOTE — Brief Op Note (Signed)
Brief Operative note   Preoperative diagnosis:  dislocated cataract Postoperative diagnosis  Dislocated traumatic cataract into the vitreous left eye  Procedures: Removal of dislocated cataract from vitreous with pars plana route and vitrectomy. Left eye  Surgeon:  Sherrie GeorgeJohn D Josefina Rynders, MD...  Assistant:  Rosalie DoctorLisa Johnson SA    Anesthesia: General  Specimen: none  Estimated blood loss:  1cc  Complications: none  Patient sent to PACU in good condition  Composed by Sherrie GeorgeJohn D Emelia Sandoval MD  Dictation number: [id number]

## 2017-12-12 NOTE — Progress Notes (Signed)
1509 Received pt from PACU. Pt alert and oriented x4. Dressing clean, dry and intact. Patient does not complain of pain at this time. Patient's sister at bedside.

## 2017-12-13 ENCOUNTER — Encounter (HOSPITAL_COMMUNITY): Payer: Self-pay | Admitting: Ophthalmology

## 2017-12-13 DIAGNOSIS — H26132 Total traumatic cataract, left eye: Secondary | ICD-10-CM | POA: Diagnosis not present

## 2017-12-13 MED ORDER — BACITRACIN-POLYMYXIN B 500-10000 UNIT/GM OP OINT: 1.0000 "application " | TOPICAL_OINTMENT | Freq: Three times a day (TID) | OPHTHALMIC | 0 refills | Status: DC

## 2017-12-13 MED ORDER — DORZOLAMIDE HCL 2 % OP SOLN: 1.0000 [drp] | Freq: Three times a day (TID) | OPHTHALMIC | 12 refills | Status: DC

## 2017-12-13 MED ORDER — PREDNISOLONE ACETATE 1 % OP SUSP: 1.0000 [drp] | Freq: Four times a day (QID) | OPHTHALMIC | 0 refills | Status: DC

## 2017-12-13 MED ORDER — BRIMONIDINE TARTRATE 0.2 % OP SOLN: 1.0000 [drp] | Freq: Two times a day (BID) | OPHTHALMIC | 12 refills | Status: DC

## 2017-12-13 MED ORDER — GATIFLOXACIN 0.5 % OP SOLN: 1.0000 [drp] | Freq: Four times a day (QID) | OPHTHALMIC | Status: DC

## 2017-12-13 NOTE — Progress Notes (Signed)
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 Add Alphagan and dorzolomide OS.  Discharge to home.  Give post operative instructions  Sherrie GeorgeJohn D Matthews 12/13/2017, 6:29 AM

## 2017-12-13 NOTE — Op Note (Signed)
NAMOrvan July:  Covell, Lovell                ACCOUNT NO.:  [id number]  MEDICAL RECORD NO.:  [id number]  LOCATION:  [facility]                         FACILITY:  [facility]  PHYSICIAN:  Beulah GandyJohn D. Ashley RoyaltyMatthews, M.D. DATE OF BIRTH:  04-17-1941  DATE OF PROCEDURE:  12/12/2017 DATE OF DISCHARGE:                              OPERATIVE REPORT   ADMISSION DIAGNOSIS:  Dislocated traumatic cataract in the left eye.  PROCEDURES:  Removal of dislocated traumatic cataract with pars plana vitrectomy and phacofragmentation in the vitreous cavity of the left eye, placement of secondary intraocular lens with suture in the left eye, laser photocoagulation in the left eye.  SURGEON:  Beulah GandyJohn D. Ashley RoyaltyMatthews, MD.  ASSISTANT:  Rosalie DoctorLisa Johnson, SA.  ANESTHESIA:  General.  DETAILS:  Usual prep and drape.  Conjunctival peritomy from 8 o'clock around to 4 o'clock.  The half thickness scleral flaps were raised at 4 o'clock and 10 o'clock in anticipation of IOL suture.  A 7-mm corneoscleral wound was created from 2 o'clock to 2 o'clock in a 3- layered fashion, 25-gauge trocars at 11 o'clock and 5 o'clock.  An MVR opening with 20-gauge was made with a 3-layered scleral opening at 2 o'clock.  Pars plana vitrectomy was begun just into the cataractous lens.  The vitreous cutter was placed in the lens and lens nuclear fragments were removed with the vitreous cutter.  When it was apparent that this maneuver was quite slow and nonproductive, the fragmatome was inserted through the 2 o'clock sclerotomy point into the cataractous lens.  The lighted pick was used to grasp the lens as the phacofragmentation was performed.  All of the nuclear fragments and lens fragments were removed with the phacofrag and the vitreous cutter. There were peripheral cortical remnants, which were removed with the vitreous cutter.  The vitrectomy was carried posteriorly and the BIOM viewing system was brought into place.  Many fragments were seen in the posterior  segment and lying on the retina.  These were carefully removed under low suction and rapid cutting until each and every remnant was removed.  The endolaser was positioned in the eye.  677 burns were placed around the retinal periphery.  The power was 400 mW, 1000 microns each and 0.1 seconds each.  The attention was carried to the pars plana area where 2 Prolene sutures were passed from 4 o'clock to 10 o'clock behind the iris and in the ciliary sulcus.  A docking needle was used for placement of the exit of the sutures.  The corneoscleral wound was then opened.  It was extended up into the cornea and then opened with keratome.  The wound was measured to 7 mm.  The vitreous sutures were then withdrawn from the eye and through the corneoscleral wound, a new intraocular lens was brought onto the field, made by [employer] ink, model CZ70BD, power 19.5D, length 12.5 mm optic 7.0 mm, serial #[id number], expiration date May 04, 2021.  The lens was brought onto the field, inspected, and cleaned.  The Prolene sutures were attached to the eyelets of the lens.  The lens was passed into the anterior chamber, then into the posterior chamber.  It was dialed into place.  The Prolene sutures were drawn securely and knotted beneath the scleral flaps. Intra-ocular lens was in proper position at this point.  The sutures were knotted and the free ends removed.  The corneal scleral wound was closed with 5 interrupted 10-0 nylon sutures.  The sclerotomy point at 2 o'clock was closed with 9-0 nylon single interrupted suture.  The conjunctiva was reposited with 7-0 chromic suture.  The 25-gauge trocars were removed from the eye.  They were tested and found to be secure. All wounds were secure.  Polymyxin and ceftazidime were rinsed around the globe to prevent infection.  Marcaine was injected around the globe for postop pain.  Decadron 10 mg was injected in the lower subconjunctival space.  Polysporin  ophthalmic ointment, patch and shield were placed.  The closing pressure was 15 with a Barraquer tonometer.  COMPLICATIONS:  None.  DURATION:  2 hours 30 minutes.  The patient was awakened, taken to Recovery in satisfactory condition.     Beulah Gandy. Ashley Royalty, M.D.     JDM/MEDQ  D:  12/12/2017  T:  12/13/2017  Job:  [id number]

## 2017-12-19 ENCOUNTER — Encounter (INDEPENDENT_AMBULATORY_CARE_PROVIDER_SITE_OTHER): Payer: Medicare HMO | Admitting: Ophthalmology

## 2017-12-19 DIAGNOSIS — H43813 Vitreous degeneration, bilateral: Secondary | ICD-10-CM | POA: Diagnosis not present

## 2017-12-19 DIAGNOSIS — H34831 Tributary (branch) retinal vein occlusion, right eye, with macular edema: Secondary | ICD-10-CM | POA: Diagnosis not present

## 2017-12-19 DIAGNOSIS — I10 Essential (primary) hypertension: Secondary | ICD-10-CM

## 2017-12-19 DIAGNOSIS — H35033 Hypertensive retinopathy, bilateral: Secondary | ICD-10-CM

## 2017-12-19 DIAGNOSIS — H4312 Vitreous hemorrhage, left eye: Secondary | ICD-10-CM

## 2017-12-28 ENCOUNTER — Encounter (INDEPENDENT_AMBULATORY_CARE_PROVIDER_SITE_OTHER): Payer: Medicare HMO | Admitting: Ophthalmology

## 2017-12-28 DIAGNOSIS — H2702 Aphakia, left eye: Secondary | ICD-10-CM

## 2018-01-10 ENCOUNTER — Other Ambulatory Visit: Payer: Self-pay | Admitting: Cardiovascular Disease

## 2018-01-10 DIAGNOSIS — I10 Essential (primary) hypertension: Secondary | ICD-10-CM

## 2018-01-10 DIAGNOSIS — I422 Other hypertrophic cardiomyopathy: Secondary | ICD-10-CM

## 2018-01-16 ENCOUNTER — Encounter (INDEPENDENT_AMBULATORY_CARE_PROVIDER_SITE_OTHER): Payer: Medicare HMO | Admitting: Ophthalmology

## 2018-01-16 DIAGNOSIS — I10 Essential (primary) hypertension: Secondary | ICD-10-CM | POA: Diagnosis not present

## 2018-01-16 DIAGNOSIS — H35033 Hypertensive retinopathy, bilateral: Secondary | ICD-10-CM | POA: Diagnosis not present

## 2018-01-16 DIAGNOSIS — H34831 Tributary (branch) retinal vein occlusion, right eye, with macular edema: Secondary | ICD-10-CM

## 2018-01-16 DIAGNOSIS — H2703 Aphakia, bilateral: Secondary | ICD-10-CM

## 2018-01-16 DIAGNOSIS — H43811 Vitreous degeneration, right eye: Secondary | ICD-10-CM

## 2018-02-06 ENCOUNTER — Other Ambulatory Visit: Payer: Self-pay | Admitting: Physician Assistant

## 2018-02-15 ENCOUNTER — Other Ambulatory Visit: Payer: Self-pay | Admitting: Cardiovascular Disease

## 2018-02-15 NOTE — Telephone Encounter (Signed)
Medication Detail    Disp Refills Start End   pravastatin (PRAVACHOL) 80 MG tablet 30 tablet 0 02/15/2018    Sig - Route: Take 1 tablet (80 mg total) by mouth daily. Please make yearly appt with Dr. Excell Seltzerooper for March before anymore refills. 1st attempt - Oral   Sent to pharmacy as: pravastatin (PRAVACHOL) 80 MG tablet   Notes to Pharmacy: Please call our office to schedule an yearly appointment with Dr. Excell Seltzerooper for March before anymore refills. [phone]. Thank you 1st attempt   E-Prescribing Status: Receipt confirmed by pharmacy (02/15/2018 11:53 AM EDT)   Pharmacy   [facility] [id number] - [place], [place] - [address] AT [facility] [address]

## 2018-02-27 ENCOUNTER — Encounter (INDEPENDENT_AMBULATORY_CARE_PROVIDER_SITE_OTHER): Payer: Medicare HMO | Admitting: Ophthalmology

## 2018-03-02 ENCOUNTER — Encounter (INDEPENDENT_AMBULATORY_CARE_PROVIDER_SITE_OTHER): Payer: Medicare HMO | Admitting: Ophthalmology

## 2018-03-02 DIAGNOSIS — H59032 Cystoid macular edema following cataract surgery, left eye: Secondary | ICD-10-CM

## 2018-03-02 DIAGNOSIS — I10 Essential (primary) hypertension: Secondary | ICD-10-CM

## 2018-03-02 DIAGNOSIS — H35033 Hypertensive retinopathy, bilateral: Secondary | ICD-10-CM

## 2018-03-02 DIAGNOSIS — H43811 Vitreous degeneration, right eye: Secondary | ICD-10-CM

## 2018-03-02 DIAGNOSIS — H34831 Tributary (branch) retinal vein occlusion, right eye, with macular edema: Secondary | ICD-10-CM

## 2018-03-06 ENCOUNTER — Other Ambulatory Visit: Payer: Self-pay | Admitting: Physician Assistant

## 2018-03-14 ENCOUNTER — Other Ambulatory Visit: Payer: Self-pay | Admitting: Cardiovascular Disease

## 2018-03-16 ENCOUNTER — Other Ambulatory Visit: Payer: Self-pay | Admitting: Cardiovascular Disease

## 2018-03-26 ENCOUNTER — Other Ambulatory Visit: Payer: Self-pay | Admitting: Cardiovascular Disease

## 2018-03-30 ENCOUNTER — Encounter: Payer: Self-pay | Admitting: Physician Assistant

## 2018-03-30 ENCOUNTER — Ambulatory Visit: Payer: Medicare HMO | Admitting: Physician Assistant

## 2018-03-30 VITALS — BP 130/64 | HR 57 | Ht 66.0 in | Wt 155.8 lb

## 2018-03-30 DIAGNOSIS — I2510 Atherosclerotic heart disease of native coronary artery without angina pectoris: Secondary | ICD-10-CM | POA: Diagnosis not present

## 2018-03-30 DIAGNOSIS — Z720 Tobacco use: Secondary | ICD-10-CM

## 2018-03-30 DIAGNOSIS — I119 Hypertensive heart disease without heart failure: Secondary | ICD-10-CM | POA: Diagnosis not present

## 2018-03-30 DIAGNOSIS — I739 Peripheral vascular disease, unspecified: Secondary | ICD-10-CM

## 2018-03-30 DIAGNOSIS — I779 Disorder of arteries and arterioles, unspecified: Secondary | ICD-10-CM | POA: Diagnosis not present

## 2018-03-30 DIAGNOSIS — I422 Other hypertrophic cardiomyopathy: Secondary | ICD-10-CM | POA: Diagnosis not present

## 2018-03-30 NOTE — Progress Notes (Signed)
Cardiology Office Note:    Date:  03/30/2018   ID:  [name], Pacheco [birth year], MRN [id number]  PCP:  Eber Hong, MD  Cardiologist:  Sherren Mocha, MD   Referring MD: Eber Hong, MD   Chief Complaint  Patient presents with  . Follow-up    Cardiomyopathy; PAD     History of Present Illness:    David Pacheco is a 77 y.o. 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    He was last seen 02/2017.  David Pacheco returns for follow-up.  He is here alone.  Since last seen, he has been doing well.  He denies chest discomfort, significant shortness of breath, dizziness or syncope, paroxysmal nocturnal dyspnea or edema.  He has an occasional cough.  He still smokes cigarettes but is trying to quit.  Prior CV studies:   The following studies were reviewed today:  Carotid US 09/01/2017 IMPRESSION: 1. Mild bilateral carotid bifurcation proximal ICA atherosclerotic vascular plaque. No flow limiting stenosis. Degree of stenosis less than 50% bilaterally. 2.  Vertebrals are patent with antegrade flow. Repeat 1 year  ABIs/arterial US 09/01/2017 R 1.06 / L 0.98 IMPRESSION: Resting ABI the bilateral lower extremities within normal limits, with the Doppler waveform at the bilateral ankles demonstrating no significant arterial occlusive disease.  Cardiac MRI 5/16 IMPRESSION: 1. Normal LV systolic function, EF 09%. Morphology of the LV was consistent with apical hypertrophic cardiomyopathy. 2. LGE pattern consistent with apical hypertrophic cardiomyopathy.  Echo 4/16 Mild LVH, EF 60-65, indeterminate diastolic function, focal apical hypertrophy, MAC  LE arterial duplex/ABIs 4/16 Diffuse plaque throughout the left lower extremity, with mildly elevated velocities in the CFA. Widely patent left SFA stent. Normal ABIs  Carotid US 4/16 Bilateral ICA 1-39 FU 2 years  Myocardial Perfusion Imaging Study 11/12 Septal  thinning, no ischemia, EF 54  LHC 12/09 LAD proximal 30 LCx with OM1 ostial 30 EF 25  Past Medical History:  Diagnosis Date  . Apical variant hypertrophic cardiomyopathy ([place])   . CAD (coronary artery disease)    nonobstructive >> LHC 12/09: pLAD 30, oOM1 30, EF 65 // Nuc study 11/12 Septal thinning, no ischemia, EF 54  . Carotid artery disease ([place]) 02/14/2017   Carotid US 4/16: Bilateral ICA 1-39 >> FU 2 years //carotid US 9/18: Bilateral ICA <50% >> follow-up 1 year  . DVT (deep venous thrombosis) ([place]) 2010   LLE  . Dyslipidemia   . Essential hypertension   . PVD (peripheral vascular disease) (HCC)    lower extremity PAD eith intermittent claudication //ABIs: R 1.06; L 0.98; Doppler waveforms demonstrate no significant arterial occlusive disease   Surgical Hx: The patient  has a past surgical history that includes lower extremity angiogram (N/A, 12/25/2013); IR FEM POP INC ATHEREC / STENT / PTA MOD SED (Left, 12/25/2013); Colonoscopy; Pars plana vitrectomy (Left, 12/12/2017); Eye surgery; Tonsillectomy (~ 1949); Cataract extraction (Right); Pars plana vitrectomy (Left, 12/12/2017); and Laser photo ablation (Left, 12/12/2017).   Current Medications: Current Meds  Medication Sig  . amLODipine (NORVASC) 10 MG tablet TAKE 1 TABLET BY MOUTH DAILY (Patient taking differently: TAKE 10 MG BY MOUTH DAILY)  . bacitracin-polymyxin b (POLYSPORIN) ophthalmic ointment Place 1 application into the left eye 3 (three) times daily. apply to eye every 12 hours while awake  . brimonidine (ALPHAGAN) 0.2 % ophthalmic solution Place 1 drop into the left eye 2 (two) times daily.  . carvedilol (COREG) 6.25 MG tablet  Take 1 tablet (6.25 mg total) by mouth 2 (two) times daily with a meal. Please keep upcoming appt for future refills. Thank you  . Cholecalciferol (VITAMIN D PO) Take 1 capsule by mouth daily.   . clopidogrel (PLAVIX) 75 MG tablet TAKE 1 TABLET BY MOUTH EVERY DAY  . dorzolamide (TRUSOPT) 2 %  ophthalmic solution Place 1 drop into the left eye 3 (three) times daily.  Marland Kitchen gatifloxacin (ZYMAXID) 0.5 % SOLN Place 1 drop into the left eye 4 (four) times daily.  Marland Kitchen lisinopril (PRINIVIL,ZESTRIL) 40 MG tablet TAKE 1 TABLET(40 MG) BY MOUTH DAILY  . pravastatin (PRAVACHOL) 80 MG tablet Take 1 tablet (80 mg total) by mouth daily. Please keep appointment for additional refills thanks.     Allergies:   Patient has no known allergies.   Social History   Tobacco Use  . Smoking status: Current Every Day Smoker    Packs/day: 0.75    Years: 60.00    Pack years: 45.00    Types: Cigarettes  . Smokeless tobacco: Never Used  Substance Use Topics  . Alcohol use: Yes    Comment: 12/12/2017 "may take a drink q 2-3 months"  . Drug use: No     Family Hx: The patient's family history includes Coronary artery disease in his brother; Heart attack in his brother; Hyperlipidemia in his father; Stroke in his mother.  ROS:   Please see the history of present illness.    Review of Systems  Respiratory: Positive for cough.    All other systems reviewed and are negative.   EKGs/Labs/Other Test Reviewed:    EKG:  EKG is not ordered today.    Recent Labs: 12/12/2017: BUN 8; Creatinine, Ser 1.00; Potassium 3.7; Sodium 141   Recent Lipid Panel Lab Results  Component Value Date/Time   CHOL  12/02/2008 05:00 AM    146        ATP III CLASSIFICATION:  <200     mg/dL   Desirable  200-239  mg/dL   Borderline High  >=240    mg/dL   High   TRIG 89 12/02/2008 05:00 AM   HDL 22 (L) 12/02/2008 05:00 AM   CHOLHDL 6.6 12/02/2008 05:00 AM   LDLCALC (H) 12/02/2008 05:00 AM    106        Total Cholesterol/HDL:CHD Risk Coronary Heart Disease Risk Table                     Men   Women  1/2 Average Risk   3.4   3.3    Physical Exam:    VS:  BP 130/64   Pulse (!) 57   Ht 5' 6"  (1.676 m)   Wt 155 lb 12.8 oz (70.7 kg)   SpO2 97%   BMI 25.15 kg/m     Wt Readings from Last 3 Encounters:  03/30/18 155 lb  12.8 oz (70.7 kg)  12/12/17 152 lb 8 oz (69.2 kg)  02/14/17 152 lb 12.8 oz (69.3 kg)     Physical Exam  Constitutional: He is oriented to person, place, and time. He appears well-developed and well-nourished. No distress.  HENT:  Head: Normocephalic and atraumatic.  Neck: Neck supple. No JVD present.  Cardiovascular: Normal rate, regular rhythm, S1 normal and S2 normal.  No murmur heard. Pulmonary/Chest: Breath sounds normal. He has no rales.  Abdominal: Soft. There is no hepatomegaly.  Musculoskeletal: He exhibits no edema.  Neurological: He is alert and oriented to person, place,  and time.  Skin: Skin is warm and dry.    ASSESSMENT & PLAN:    #1.  Hypertensive heart disease without CHF The patient's blood pressure is controlled on his current regimen.  Continue current therapy.   #2.  Apical variant hypertrophic cardiomyopathy ([place]) Asymptomatic.  MRI in May 2016 demonstrated EF 70 to and morphology consistent with apical hypertrophic cardiomyopathy.  He is due for follow-up echocardiogram.  -Continue beta-blocker  -Arrange follow-up echocardiogram in Between  #3.  Non-obstructive CAD by Cardiac Cath in 2009 No anginal symptoms.  Continue clopidogrel, statin.  #4.  PAD (peripheral artery disease) ([place]) History of left SFA stenting.  ABIs in 2018 were normal.  Continue clopidogrel, pravastatin.  #5.  Bilateral carotid artery disease, unspecified type ([place]) Mild bilateral plaquing back carotid US in 2018.  Continue clopidogrel, pravastatin.  Repeat 1 year.  #6.  Tobacco abuse We discussed the importance of quitting.   Dispo:  Return in about 1 year (around 03/31/2019) for Routine Follow Up, w/ Dr. Burt Knack, or Richardson Dopp, PA-C.   Medication Adjustments/Labs and Tests Ordered: Current medicines are reviewed at length with the patient today.  Concerns regarding medicines are outlined above.  Tests Ordered: No orders of the defined types were placed in this  encounter.  Medication Changes: No orders of the defined types were placed in this encounter.   Signed, Richardson Dopp, PA-C  03/30/2018 1:52 PM    [facility] [place], [place], [place]  [address] Phone: [phone]; Fax: [phone]

## 2018-03-30 NOTE — Patient Instructions (Signed)
Medication Instructions: Your physician recommends that you continue on your current medications as directed. Please refer to the Current Medication list given to you today. \  Labwork: None Ordred  Procedures/Testing: Your physician has requested that you have an echocardi[facility] at [facility]. Echocardiography is a painless test that uses sound waves to create images of your heart. It provides your doctor with information about the size and shape of your heart and how well your heart's chambers and valves are working. This procedure takes approximately one hour. There are no restrictions for this procedure.    Follow-Up: Your physician wants you to follow-up in: one year with Dr.Cooper You will receive a reminder letter in the mail two months in advance. If you don't receive a letter, please call our office to schedule the follow-up appointment.   Any Additional Special Instructions Will Be Listed Below (If Applicable).     If you need a refill on your cardiac medications before your next appointment, please call your pharmacy.

## 2018-04-02 ENCOUNTER — Ambulatory Visit (HOSPITAL_COMMUNITY)
Admission: RE | Admit: 2018-04-02 | Discharge: 2018-04-02 | Disposition: A | Discharge status: Home / Self Care | Payer: Medicare HMO | Source: Ambulatory Visit | Attending: Physician Assistant | Admitting: Physician Assistant

## 2018-04-02 ENCOUNTER — Encounter: Payer: Self-pay | Admitting: Physician Assistant

## 2018-04-02 DIAGNOSIS — I119 Hypertensive heart disease without heart failure: Secondary | ICD-10-CM | POA: Diagnosis not present

## 2018-04-02 DIAGNOSIS — I509 Heart failure, unspecified: Secondary | ICD-10-CM | POA: Insufficient documentation

## 2018-04-02 DIAGNOSIS — E785 Hyperlipidemia, unspecified: Secondary | ICD-10-CM | POA: Diagnosis not present

## 2018-04-02 DIAGNOSIS — I361 Nonrheumatic tricuspid (valve) insufficiency: Secondary | ICD-10-CM | POA: Diagnosis not present

## 2018-04-02 DIAGNOSIS — I2510 Atherosclerotic heart disease of native coronary artery without angina pectoris: Secondary | ICD-10-CM | POA: Insufficient documentation

## 2018-04-02 NOTE — Progress Notes (Signed)
*  PRELIMINARY RESULTS* Echocardiogram 2D Echocardiogram has been performed.  David Pacheco 04/02/2018, 2:33 PM

## 2018-04-03 ENCOUNTER — Telehealth: Payer: Self-pay | Admitting: *Deleted

## 2018-04-03 NOTE — Telephone Encounter (Signed)
Pt has been notified of echo results by phone with verbal  Understanding. Pt thanked me for my call.

## 2018-04-03 NOTE — Telephone Encounter (Signed)
-----   Message from Beatrice Lecher, [place] sent at 04/02/2018  9:28 PM EDT ----- Please call the patient. The echocardiogram shows that he heart function (ejection fraction) is normal.  There is moderate stiffness (diastolic dysfunction).  There is abnormal thickening of the tip of the heart ("apical hypertrophy").  This echocardiogram is similar to the prior echocardiogram in 2016 without significant change. Continue current medications and follow up as planned.  Please fax a copy to PCP:  Kathlee Nations, MD  Tereso Newcomer, PA-C    04/02/2018 9:24 PM

## 2018-04-07 ENCOUNTER — Other Ambulatory Visit: Payer: Self-pay | Admitting: Cardiovascular Disease

## 2018-04-07 DIAGNOSIS — I422 Other hypertrophic cardiomyopathy: Secondary | ICD-10-CM

## 2018-04-07 DIAGNOSIS — I10 Essential (primary) hypertension: Secondary | ICD-10-CM

## 2018-04-13 ENCOUNTER — Encounter (INDEPENDENT_AMBULATORY_CARE_PROVIDER_SITE_OTHER): Payer: Medicare HMO | Admitting: Ophthalmology

## 2018-04-13 ENCOUNTER — Other Ambulatory Visit: Payer: Self-pay | Admitting: Cardiovascular Disease

## 2018-04-13 DIAGNOSIS — H35033 Hypertensive retinopathy, bilateral: Secondary | ICD-10-CM

## 2018-04-13 DIAGNOSIS — H34831 Tributary (branch) retinal vein occlusion, right eye, with macular edema: Secondary | ICD-10-CM | POA: Diagnosis not present

## 2018-04-13 DIAGNOSIS — I10 Essential (primary) hypertension: Secondary | ICD-10-CM

## 2018-04-13 DIAGNOSIS — H59032 Cystoid macular edema following cataract surgery, left eye: Secondary | ICD-10-CM

## 2018-04-13 DIAGNOSIS — H43811 Vitreous degeneration, right eye: Secondary | ICD-10-CM

## 2018-05-12 ENCOUNTER — Other Ambulatory Visit: Payer: Self-pay | Admitting: Cardiovascular Disease

## 2018-05-14 NOTE — Telephone Encounter (Signed)
Outpatient Medication Detail    Disp Refills Start End   clopidogrel (PLAVIX) 75 MG tablet 30 tablet 10 04/13/2018    Sig: TAKE 1 TABLET BY MOUTH EVERY DAY   Sent to pharmacy as: clopidogrel (PLAVIX) 75 MG tablet   E-Prescribing Status: Receipt confirmed by pharmacy (04/13/2018 4:30 PM EDT)   Pharmacy   [id number] - [place], [place] - [address] AT [facility] [address]

## 2018-05-24 ENCOUNTER — Encounter (INDEPENDENT_AMBULATORY_CARE_PROVIDER_SITE_OTHER): Payer: Medicare HMO | Admitting: Ophthalmology

## 2018-05-24 DIAGNOSIS — H43811 Vitreous degeneration, right eye: Secondary | ICD-10-CM

## 2018-05-24 DIAGNOSIS — I10 Essential (primary) hypertension: Secondary | ICD-10-CM | POA: Diagnosis not present

## 2018-05-24 DIAGNOSIS — H35033 Hypertensive retinopathy, bilateral: Secondary | ICD-10-CM | POA: Diagnosis not present

## 2018-05-24 DIAGNOSIS — H34831 Tributary (branch) retinal vein occlusion, right eye, with macular edema: Secondary | ICD-10-CM

## 2018-05-29 ENCOUNTER — Other Ambulatory Visit: Payer: Self-pay | Admitting: Physician Assistant

## 2018-06-29 ENCOUNTER — Encounter (INDEPENDENT_AMBULATORY_CARE_PROVIDER_SITE_OTHER): Payer: Medicare HMO | Admitting: Ophthalmology

## 2018-06-29 DIAGNOSIS — H43811 Vitreous degeneration, right eye: Secondary | ICD-10-CM

## 2018-06-29 DIAGNOSIS — H34831 Tributary (branch) retinal vein occlusion, right eye, with macular edema: Secondary | ICD-10-CM | POA: Diagnosis not present

## 2018-06-29 DIAGNOSIS — H35033 Hypertensive retinopathy, bilateral: Secondary | ICD-10-CM

## 2018-06-29 DIAGNOSIS — I10 Essential (primary) hypertension: Secondary | ICD-10-CM

## 2018-07-31 ENCOUNTER — Encounter (INDEPENDENT_AMBULATORY_CARE_PROVIDER_SITE_OTHER): Payer: Medicare HMO | Admitting: Ophthalmology

## 2018-07-31 DIAGNOSIS — H35033 Hypertensive retinopathy, bilateral: Secondary | ICD-10-CM

## 2018-07-31 DIAGNOSIS — H34831 Tributary (branch) retinal vein occlusion, right eye, with macular edema: Secondary | ICD-10-CM | POA: Diagnosis not present

## 2018-07-31 DIAGNOSIS — I10 Essential (primary) hypertension: Secondary | ICD-10-CM

## 2018-07-31 DIAGNOSIS — H43813 Vitreous degeneration, bilateral: Secondary | ICD-10-CM | POA: Diagnosis not present

## 2018-08-21 ENCOUNTER — Other Ambulatory Visit: Payer: Self-pay | Admitting: Cardiovascular Disease

## 2018-08-28 ENCOUNTER — Encounter (INDEPENDENT_AMBULATORY_CARE_PROVIDER_SITE_OTHER): Payer: Medicare HMO | Admitting: Ophthalmology

## 2018-08-28 DIAGNOSIS — H34831 Tributary (branch) retinal vein occlusion, right eye, with macular edema: Secondary | ICD-10-CM

## 2018-08-28 DIAGNOSIS — I10 Essential (primary) hypertension: Secondary | ICD-10-CM | POA: Diagnosis not present

## 2018-08-28 DIAGNOSIS — H43811 Vitreous degeneration, right eye: Secondary | ICD-10-CM

## 2018-08-28 DIAGNOSIS — H35033 Hypertensive retinopathy, bilateral: Secondary | ICD-10-CM

## 2018-09-25 ENCOUNTER — Encounter (INDEPENDENT_AMBULATORY_CARE_PROVIDER_SITE_OTHER): Payer: Medicare HMO | Admitting: Ophthalmology

## 2018-09-25 DIAGNOSIS — H43811 Vitreous degeneration, right eye: Secondary | ICD-10-CM | POA: Diagnosis not present

## 2018-09-25 DIAGNOSIS — I10 Essential (primary) hypertension: Secondary | ICD-10-CM | POA: Diagnosis not present

## 2018-09-25 DIAGNOSIS — H35033 Hypertensive retinopathy, bilateral: Secondary | ICD-10-CM

## 2018-09-25 DIAGNOSIS — H34831 Tributary (branch) retinal vein occlusion, right eye, with macular edema: Secondary | ICD-10-CM

## 2018-10-23 ENCOUNTER — Encounter (INDEPENDENT_AMBULATORY_CARE_PROVIDER_SITE_OTHER): Payer: Medicare HMO | Admitting: Ophthalmology

## 2018-10-23 DIAGNOSIS — H34831 Tributary (branch) retinal vein occlusion, right eye, with macular edema: Secondary | ICD-10-CM | POA: Diagnosis not present

## 2018-10-23 DIAGNOSIS — I10 Essential (primary) hypertension: Secondary | ICD-10-CM

## 2018-10-23 DIAGNOSIS — H35033 Hypertensive retinopathy, bilateral: Secondary | ICD-10-CM | POA: Diagnosis not present

## 2018-10-23 DIAGNOSIS — H43813 Vitreous degeneration, bilateral: Secondary | ICD-10-CM | POA: Diagnosis not present

## 2018-10-24 ENCOUNTER — Encounter

## 2018-11-05 ENCOUNTER — Other Ambulatory Visit: Payer: Self-pay | Admitting: Cardiovascular Disease

## 2018-11-05 MED ORDER — CARVEDILOL 6.25 MG PO TABS: 6.2500 mg | ORAL_TABLET | Freq: Two times a day (BID) | ORAL | 1 refills | Status: DC

## 2018-11-20 ENCOUNTER — Encounter (INDEPENDENT_AMBULATORY_CARE_PROVIDER_SITE_OTHER): Payer: Managed Care, Other (non HMO) | Admitting: Ophthalmology

## 2018-11-20 DIAGNOSIS — H43813 Vitreous degeneration, bilateral: Secondary | ICD-10-CM

## 2018-11-20 DIAGNOSIS — H34831 Tributary (branch) retinal vein occlusion, right eye, with macular edema: Secondary | ICD-10-CM

## 2018-11-20 DIAGNOSIS — I10 Essential (primary) hypertension: Secondary | ICD-10-CM

## 2018-11-20 DIAGNOSIS — H35033 Hypertensive retinopathy, bilateral: Secondary | ICD-10-CM | POA: Diagnosis not present

## 2018-12-25 ENCOUNTER — Encounter (INDEPENDENT_AMBULATORY_CARE_PROVIDER_SITE_OTHER): Payer: Managed Care, Other (non HMO) | Admitting: Ophthalmology

## 2019-01-10 ENCOUNTER — Encounter (INDEPENDENT_AMBULATORY_CARE_PROVIDER_SITE_OTHER): Payer: Managed Care, Other (non HMO) | Admitting: Ophthalmology

## 2019-01-17 ENCOUNTER — Encounter (INDEPENDENT_AMBULATORY_CARE_PROVIDER_SITE_OTHER): Payer: Medicare HMO | Admitting: Ophthalmology

## 2019-01-31 ENCOUNTER — Encounter (INDEPENDENT_AMBULATORY_CARE_PROVIDER_SITE_OTHER): Payer: Medicare HMO | Admitting: Ophthalmology

## 2019-01-31 DIAGNOSIS — H34831 Tributary (branch) retinal vein occlusion, right eye, with macular edema: Secondary | ICD-10-CM | POA: Diagnosis not present

## 2019-01-31 DIAGNOSIS — I10 Essential (primary) hypertension: Secondary | ICD-10-CM

## 2019-01-31 DIAGNOSIS — H43811 Vitreous degeneration, right eye: Secondary | ICD-10-CM | POA: Diagnosis not present

## 2019-01-31 DIAGNOSIS — H35033 Hypertensive retinopathy, bilateral: Secondary | ICD-10-CM | POA: Diagnosis not present

## 2019-02-28 ENCOUNTER — Encounter (INDEPENDENT_AMBULATORY_CARE_PROVIDER_SITE_OTHER): Payer: Medicare HMO | Admitting: Ophthalmology

## 2019-03-02 IMAGING — US US CAROTID DUPLEX BILAT
1 series · 13 of 24 positions shown · non-contrast
Comparison: No recent prior .

CLINICAL DATA: Cardiomyopathy.

EXAM:
BILATERAL CAROTID DUPLEX ULTRASOUND
TECHNIQUE: Gray scale imaging, color Doppler and duplex ultrasound were
performed of bilateral carotid and vertebral arteries in the neck.

[Series 1: us carotid duplex bilat · 0.06mm/px · 13 of 98 slices shown]
[im 1/98]
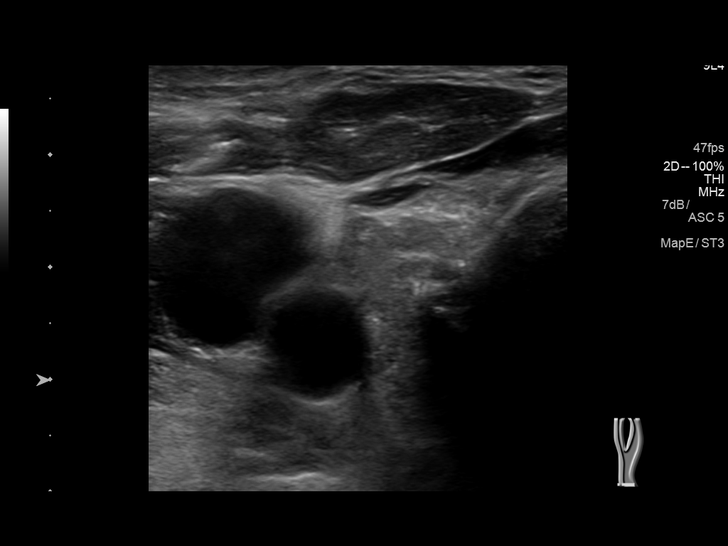
[im 9/98]
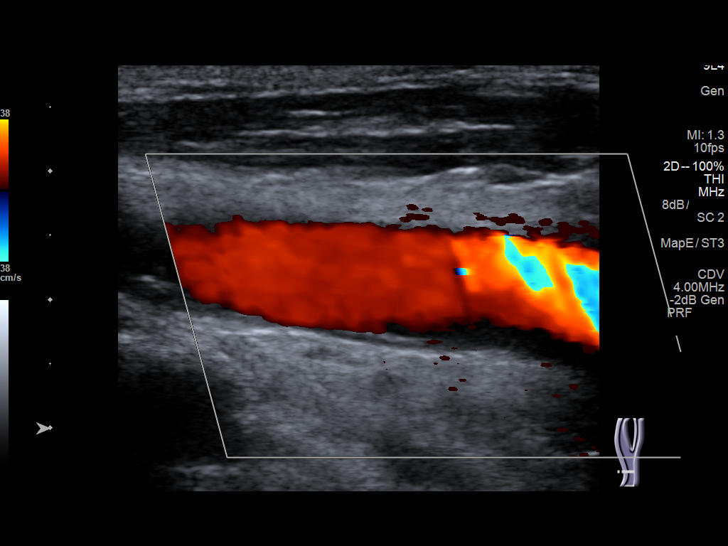
[im 17/98]
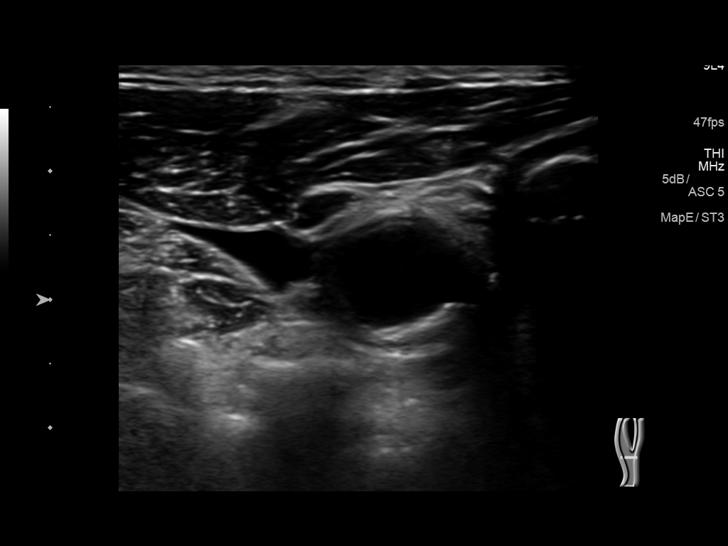
[im 26/98]
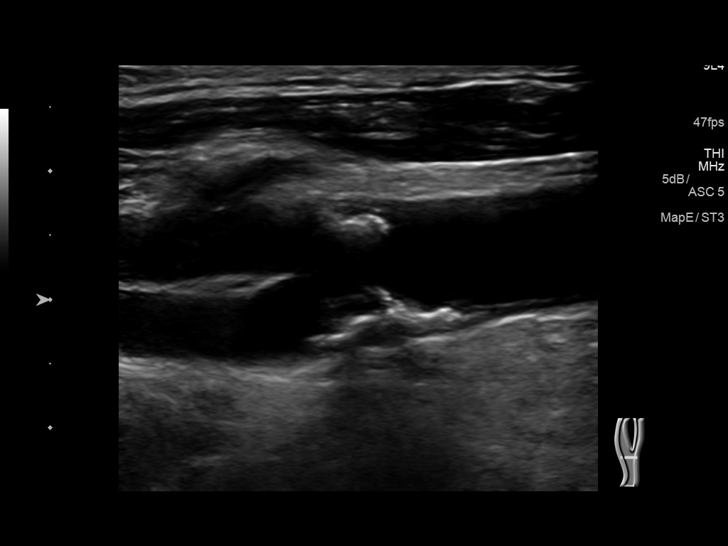
[im 34/98]
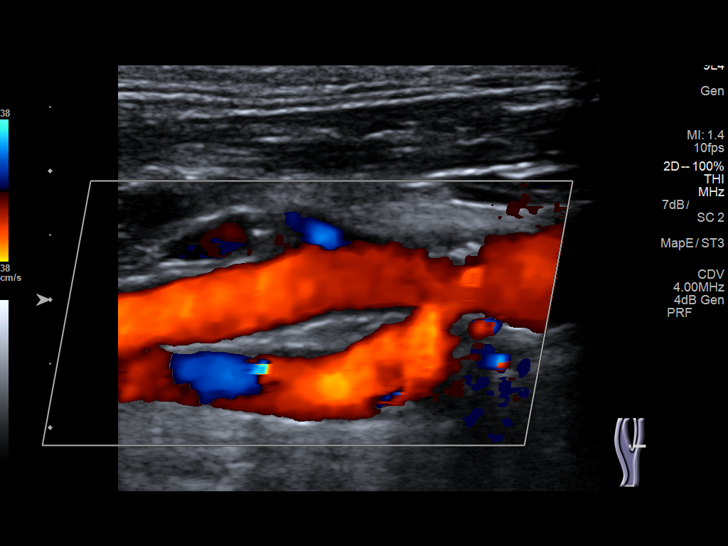
[im 43/98]
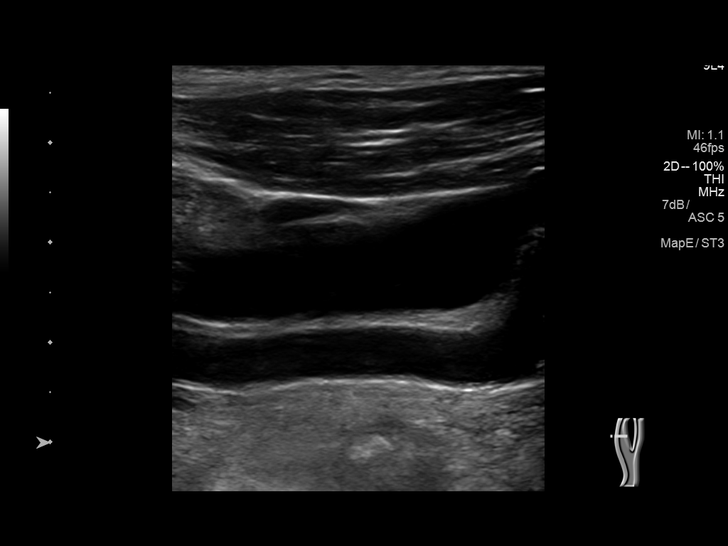
[im 51/98]
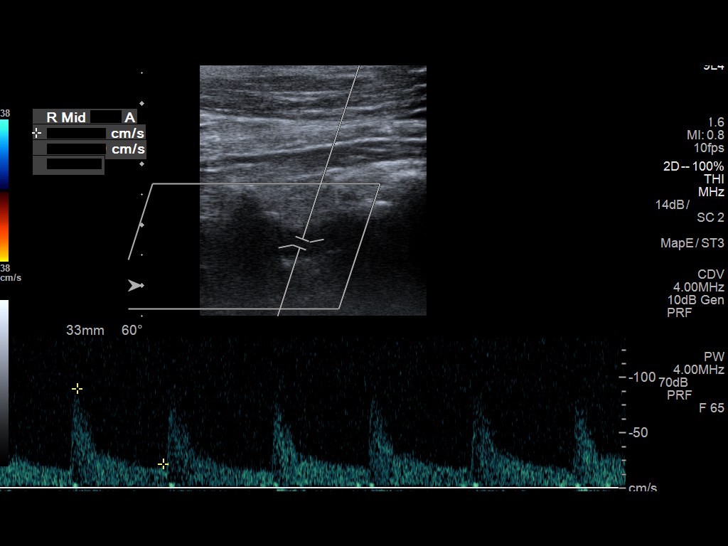
[im 55/98]
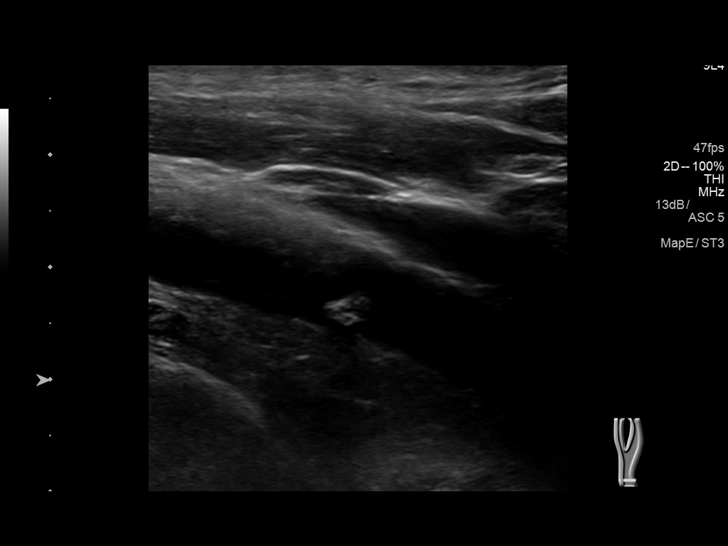
[im 64/98]
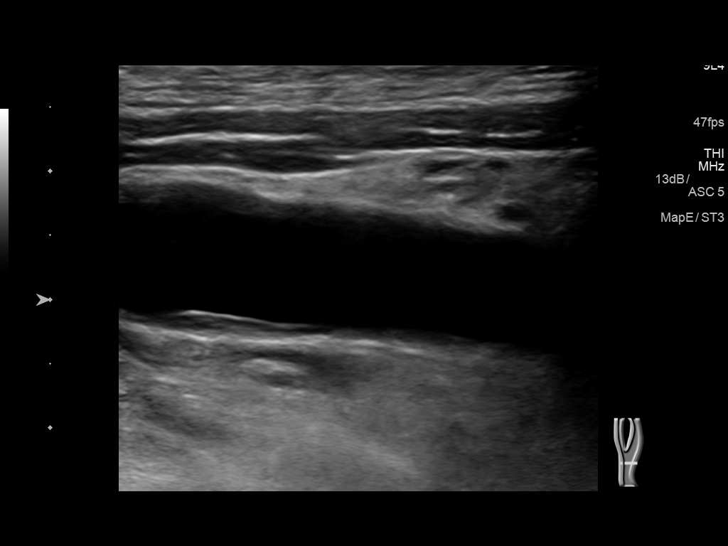
[im 72/98]
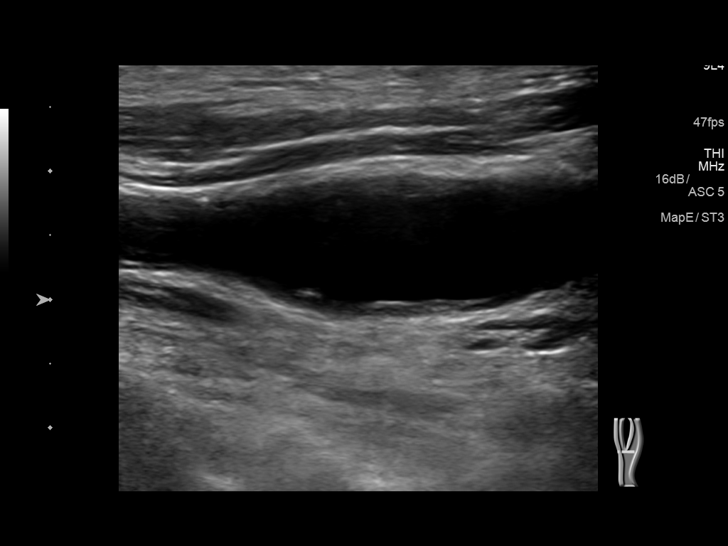
[im 81/98]
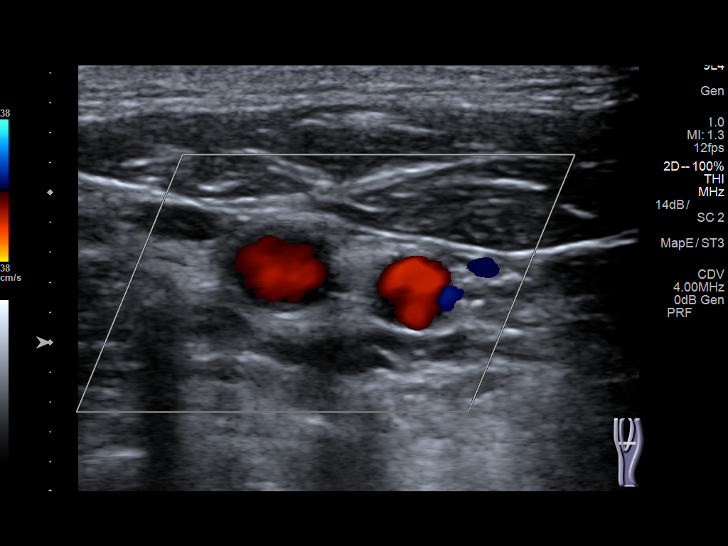
[im 89/98]
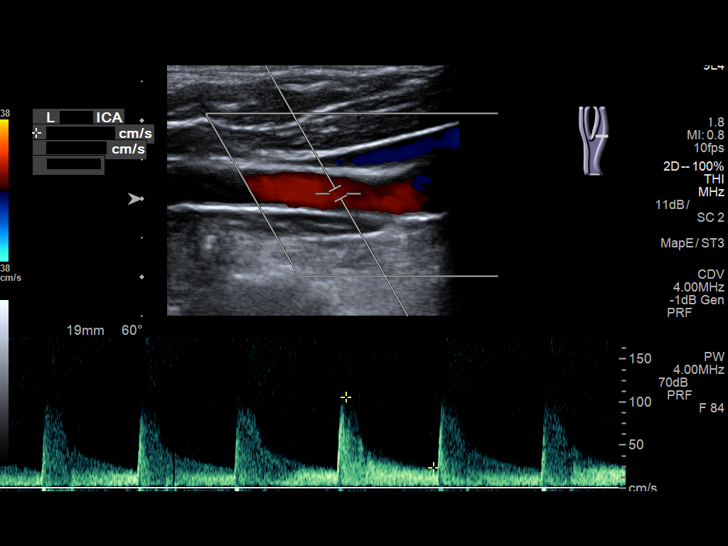
[im 98/98]
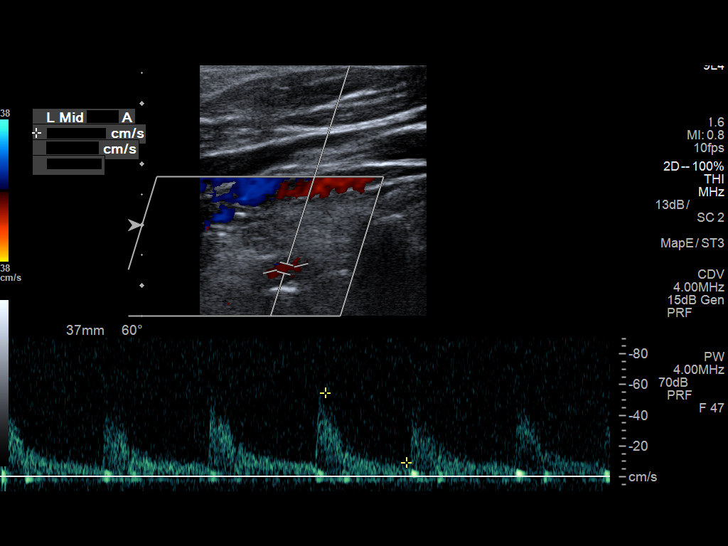

[13 of 24 positions shown; findings below may reference images not displayed]

FINDINGS: Criteria: Quantification of carotid stenosis is based on velocity
parameters that correlate the residual internal carotid diameter
with NASCET-based stenosis levels, using the diameter of the distal
internal carotid lumen as the denominator for stenosis measurement.

The following velocity measurements were obtained:

RIGHT

ICA:  158/27 cm/sec

CCA:  106/16 cm/sec

SYSTOLIC ICA/CCA RATIO:

DIASTOLIC ICA/CCA RATIO:

ECA:  153 cm/sec

LEFT

ICA:  109/28 cm/sec

CCA:  95/10 cm/sec

SYSTOLIC ICA/CCA RATIO:

DIASTOLIC ICA/CCA RATIO:

ECA:  155 cm/sec

RIGHT CAROTID ARTERY: Mild right carotid bifurcation proximal ICA
atherosclerotic vascular plaque. No flow limiting stenosis.

RIGHT VERTEBRAL ARTERY:  Patent with antegrade flow.

LEFT CAROTID ARTERY: Mild left carotid bifurcation proximal ICA
atherosclerotic vascular plaque. No flow limiting stenosis.

LEFT VERTEBRAL ARTERY:  Patent with antegrade flow.
IMPRESSION: 1. Mild bilateral carotid bifurcation proximal ICA atherosclerotic
vascular plaque. No flow limiting stenosis. Degree of stenosis less
than 50% bilaterally.

2.  Vertebrals are patent with antegrade flow.

## 2019-03-25 ENCOUNTER — Other Ambulatory Visit: Payer: Self-pay

## 2019-03-25 ENCOUNTER — Telehealth: Payer: Self-pay | Admitting: Physician Assistant

## 2019-03-25 ENCOUNTER — Encounter (INDEPENDENT_AMBULATORY_CARE_PROVIDER_SITE_OTHER): Payer: Medicare HMO | Admitting: Ophthalmology

## 2019-03-25 DIAGNOSIS — I10 Essential (primary) hypertension: Secondary | ICD-10-CM

## 2019-03-25 DIAGNOSIS — H34831 Tributary (branch) retinal vein occlusion, right eye, with macular edema: Secondary | ICD-10-CM

## 2019-03-25 DIAGNOSIS — H43813 Vitreous degeneration, bilateral: Secondary | ICD-10-CM | POA: Diagnosis not present

## 2019-03-25 DIAGNOSIS — H35033 Hypertensive retinopathy, bilateral: Secondary | ICD-10-CM

## 2019-03-25 NOTE — Telephone Encounter (Signed)
Patient is returning call.  °

## 2019-03-25 NOTE — Telephone Encounter (Signed)
NEW MESSAGE :  Spoke with patient.    PHONE visit on 04/01/2019. No smartphone.     Phone call to obtain consent   -   03/25/2019        Virtual Visit Pre-Appointment Phone Call  Steps For Call:  1. Confirm consent - "In the setting of the current Covid19 crisis, you are scheduled for a (phone or video) visit with your provider on (date) at (time).  Just as we do with many in-office visits, in order for you to participate in this visit, we must obtain consent.  If you'd like, I can send this to your mychart (if signed up) or email for you to review.  Otherwise, I can obtain your verbal consent now.  All virtual visits are billed to your insurance company just like a normal visit would be.  By agreeing to a virtual visit, we'd like you to understand that the technology does not allow for your provider to perform an examination, and thus may limit your provider's ability to fully assess your condition. If your provider identifies any concerns that need to be evaluated in person, we will make arrangements to do so.  Finally, though the technology is pretty good, we cannot assure that it will always work on either your or our end, and in the setting of a video visit, we may have to convert it to a phone-only visit.  In either situation, we cannot ensure that we have a secure connection.  Are you willing to proceed?" STAFF: Did the patient verbally acknowledge consent to telehealth visit? Document YES/NO here: YES  2. Confirm the BEST phone number to call the day of the visit by including in appointment notes  3. Give patient instructions for WebEx/MyChart download to smartphone as below or Doximity/Doxy.me if video visit (depending on what platform provider is using)  4. Advise patient to be prepared with their blood pressure, heart rate, weight, any heart rhythm information, their current medicines, and a piece of paper and pen handy for any instructions they may receive the day of their  visit  5. Inform patient they will receive a phone call 15 minutes prior to their appointment time (may be from unknown caller ID) so they should be prepared to answer  6. Confirm that appointment type is correct in Epic appointment notes (VIDEO vs PHONE)     TELEPHONE CALL NOTE  David Pacheco Cirillo has been deemed a candidate for a follow-up tele-health visit to limit community exposure during the Covid-19 pandemic. I spoke with the patient via phone to ensure availability of phone/video source, confirm preferred email & phone number, and discuss instructions and expectations.  I reminded David Pacheco Datta to be prepared with any vital sign and/or heart rhythm information that could potentially be obtained via home monitoring, at the time of his visit. I reminded David Pacheco Dispenza to expect a phone call at the time of his visit if his visit.  David Pacheco 03/25/2019 11:49 AM   INSTRUCTIONS FOR DOWNLOADING THE WEBEX APP TO SMARTPHONE  - If Apple, ask patient to go to App Store and type in WebEx in the search bar. Download [employer]ex Meetings, the blue/green circle. If Android, go to [employer] Play Store and type in [employer]Ex in the search bar. The app is free but as with any other app downloads, their phone may require them to verify saved payment information or Apple/Android password.  - The patient does NOT have to create an account. - On  the day of the visit, the assist will walk the patient through joining the meeting with the meeting number/password.  INSTRUCTIONS FOR DOWNLOADING THE MYCHART APP TO SMARTPHONE  - The patient must first make sure to have activated MyChart and know their login information - If Apple, go to [employer] and type in MyChart in the search bar and download the app. If Android, ask patient to go to [employer] and type in Berry in the search bar and download the app. The app is free but as with any other app downloads, their phone may require them to verify saved payment  information or Apple/Android password.  - The patient will need to then log into the app with their MyChart username and password, and select [facility] as their healthcare provider to link the account. When it is time for your visit, go to the MyChart app, find appointments, and click Begin Video Visit. Be sure to Select Allow for your device to access the Microphone and Camera for your visit. You will then be connected, and your provider will be with you shortly.  **If they have any issues connecting, or need assistance please contact MyChart service desk [phone])**  **If using a computer, in order to ensure the best quality for their visit they will need to use either of the following Internet Browsers: [employer], or Google Chrome**  IF USING DOXIMITY or DOXY.ME - The patient will receive a link just prior to their visit, either by text or email (to be determined day of appointment depending on if it's doxy.me or Doximity).     FULL LENGTH CONSENT FOR TELE-HEALTH VISIT   I hereby voluntarily request, consent and authorize [facility] and its employed or contracted physicians, physician assistants, nurse practitioners or other licensed health care professionals (the Practitioner), to provide me with telemedicine health care services (the Services") as deemed necessary by the treating Practitioner. I acknowledge and consent to receive the Services by the Practitioner via telemedicine. I understand that the telemedicine visit will involve communicating with the Practitioner through live audiovisual communication technology and the disclosure of certain medical information by electronic transmission. I acknowledge that I have been given the opportunity to request an in-person assessment or other available alternative prior to the telemedicine visit and am voluntarily participating in the telemedicine visit.  I understand that I have the right to withhold or withdraw my  consent to the use of telemedicine in the course of my care at any time, without affecting my right to future care or treatment, and that the Practitioner or I may terminate the telemedicine visit at any time. I understand that I have the right to inspect all information obtained and/or recorded in the course of the telemedicine visit and may receive copies of available information for a reasonable fee.  I understand that some of the potential risks of receiving the Services via telemedicine include:   Delay or interruption in medical evaluation due to technological equipment failure or disruption;  Information transmitted may not be sufficient (e.g. poor resolution of images) to allow for appropriate medical decision making by the Practitioner; and/or   In rare instances, security protocols could fail, causing a breach of personal health information.  Furthermore, I acknowledge that it is my responsibility to provide information about my medical history, conditions and care that is complete and accurate to the best of my ability. I acknowledge that Practitioner's advice, recommendations, and/or decision may be based on factors not within  their control, such as incomplete or inaccurate data provided by me or distortions of diagnostic images or specimens that may result from electronic transmissions. I understand that the practice of medicine is not an exact science and that Practitioner makes no warranties or guarantees regarding treatment outcomes. I acknowledge that I will receive a copy of this consent concurrently upon execution via email to the email address I last provided but may also request a printed copy by calling the office of [facility].    I understand that my insurance will be billed for this visit.   I have read or had this consent read to me.  I understand the contents of this consent, which adequately explains the benefits and risks of the Services being provided via telemedicine.    I have been provided ample opportunity to ask questions regarding this consent and the Services and have had my questions answered to my satisfaction.  I give my informed consent for the services to be provided through the use of telemedicine in my medical care  By participating in this telemedicine visit I agree to the above.

## 2019-03-25 NOTE — Telephone Encounter (Signed)
°  4/20 :  Left VM for patient to return call regarding visit on 04/01/2019.

## 2019-03-28 ENCOUNTER — Other Ambulatory Visit: Payer: Self-pay | Admitting: Cardiovascular Disease

## 2019-03-28 DIAGNOSIS — I422 Other hypertrophic cardiomyopathy: Secondary | ICD-10-CM

## 2019-03-28 DIAGNOSIS — I10 Essential (primary) hypertension: Secondary | ICD-10-CM

## 2019-03-31 NOTE — Progress Notes (Signed)
Virtual Visit via Telephone Note   This visit type was conducted due to national recommendations for restrictions regarding the COVID-19 Pandemic (e.g. social distancing) in an effort to limit this patient's exposure and mitigate transmission in our community.  Due to his co-morbid illnesses, this patient is at least at moderate risk for complications without adequate follow up.  This format is felt to be most appropriate for this patient at this time.  The patient did not have access to video technology/had technical difficulties with video requiring transitioning to audio format only (telephone).  All issues noted in this document were discussed and addressed.  No physical exam could be performed with this format.  Please refer to the patient's chart for his  consent to telehealth for [facility].   Evaluation Performed:  Follow-up visit  Date:  04/01/2019   ID:  David, Pacheco [birth year], MRN [id number]  Patient Location: Home Provider Location: Home  PCP:  Eber Hong, MD  Cardiologist:  Sherren Mocha, MD   Electrophysiologist:  None   Chief Complaint:  FU on apical variant hypertrophic CM  History of Present Illness:    David Pacheco is a 78 y.o. 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 He was last seen in 03/2018.    Today, he is doing well.  He has not had any chest pain, shortness of breath, syncope, paroxysmal nocturnal dyspnea, leg swelling.  He has some L leg claudication if he does a significant amount of activity.  This is unchanged.   The patient does not have symptoms concerning for COVID-19 infection (fever, chills, cough, or new shortness of breath).    Past Medical History:  Diagnosis Date  . Apical variant hypertrophic cardiomyopathy ([place])    Echo 4/19: mod conc LVH, apical hypertrophy, EF 60-65, no RWMA, Gr 2 DD, MAC, mild TR  . CAD (coronary artery disease)    nonobstructive >> LHC 12/09:  pLAD 30, oOM1 30, EF 65 // Nuc study 11/12 Septal thinning, no ischemia, EF 54  . Carotid artery disease ([place]) 02/14/2017   Carotid US 4/16: Bilateral ICA 1-39 >> FU 2 years //carotid US 9/18: Bilateral ICA <50% >> follow-up 1 year  . DVT (deep venous thrombosis) ([place]) 2010   LLE  . Dyslipidemia   . Essential hypertension   . PVD (peripheral vascular disease) (H[facility])    lower extremity PAD eith intermittent claudication //ABIs: R 1.06; L 0.98; Doppler waveforms demonstrate no significant arterial occlusive disease   Past Surgical History:  Procedure Laterality Date  . CATARACT EXTRACTION Right   . COLONOSCOPY    . EYE SURGERY    . IR FEM POP INC ATHEREC / STENT / PTA MOD SED Left 12/25/2013   Also, Balloon angioplasty of the ostial left profunda artery in a kissing fashion with the left SFA  . LASER PHOTO ABLATION Left 12/12/2017   Procedure: LASER PHOTO ABLATION;  Surgeon: Hayden Pedro, MD;  Location: [facility];  Service: Ophthalmology;  Laterality: Left;  . LOWER EXTREMITY ANGIOGRAM N/A 12/25/2013   Procedure: LOWER EXTREMITY ANGIOGRAM;  Surgeon: Wellington Hampshire, MD;  Location: [facility];  Service: Cardiovascular;  Laterality: N/A;  . PARS PLANA VITRECTOMY Left 12/12/2017   PARS PLANA VITRECTOMY WITH 25G REMOVAL/SUTURE INTRAOCULAR LENS  . PARS PLANA VITRECTOMY Left 12/12/2017   Procedure: PARS PLANA VITRECTOMY WITH 25G REMOVAL/SUTURE INTRAOCULAR LENS;  Surgeon: Hayden Pedro, MD;  Location: [facility];  Service: Ophthalmology;  Laterality: Left;  . TONSILLECTOMY  ~ 1949     Current Meds  Medication Sig  . albuterol (VENTOLIN HFA) 108 (90 Base) MCG/ACT inhaler INHALE 2 PUFFS BY MOUTH EVERY 6 HOURS AS NEEDED FOR SHORTNESS OF BREATH  . amLODipine (NORVASC) 10 MG tablet TAKE 1 TABLET BY MOUTH DAILY  . bacitracin-polymyxin b (POLYSPORIN) ophthalmic ointment Place 1 application into the left eye 3 (three) times daily. apply to eye every 12 hours while awake  . brimonidine (ALPHAGAN) 0.2 %  ophthalmic solution Place 1 drop into the left eye 2 (two) times daily.  . carvedilol (COREG) 6.25 MG tablet Take 1 tablet (6.25 mg total) by mouth 2 (two) times daily with a meal.  . Cholecalciferol (VITAMIN D PO) Take 1 capsule by mouth daily.   . clopidogrel (PLAVIX) 75 MG tablet TAKE 1 TABLET BY MOUTH EVERY DAY  . dorzolamide (TRUSOPT) 2 % ophthalmic solution Place 1 drop into the left eye 3 (three) times daily.  Marland Kitchen gatifloxacin (ZYMAXID) 0.5 % SOLN Place 1 drop into the left eye 4 (four) times daily.  Marland Kitchen lisinopril (ZESTRIL) 40 MG tablet TAKE 1 TABLET(40 MG) BY MOUTH DAILY  . pravastatin (PRAVACHOL) 80 MG tablet TAKE 1 TABLET(80 MG) BY MOUTH DAILY     Allergies:   Patient has no known allergies.   Social History   Tobacco Use  . Smoking status: Current Every Day Smoker    Packs/day: 0.75    Years: 60.00    Pack years: 45.00    Types: Cigarettes  . Smokeless tobacco: Never Used  Substance Use Topics  . Alcohol use: Yes    Comment: 12/12/2017 "may take a drink q 2-3 months"  . Drug use: No     Family Hx: The patient's family history includes Coronary artery disease in his brother; Heart attack in his brother; Hyperlipidemia in his father; Stroke in his mother.  ROS:   Please see the history of present illness.    All other systems reviewed and are negative.   Prior CV studies:   The following studies were reviewed today:  Echo 04/02/18 Mild conc LVH, apical hypertrophy, EF 60-65, no RWMA, Gr 2 DD, mild MAC, mild TR  Carotid US 09/01/2017 IMPRESSION: 1. Mild bilateral carotid bifurcation proximal ICA atherosclerotic vascular plaque. No flow limiting stenosis. Degree of stenosis less than 50% bilaterally. 2.  Vertebrals are patent with antegrade flow. Repeat 1 year   ABIs/arterial US 09/01/2017 R 1.06 / L 0.98 IMPRESSION: Resting ABI the bilateral lower extremities within normal limits, with the Doppler waveform at the bilateral ankles demonstrating no significant  arterial occlusive disease.   Cardiac MRI 5/16 IMPRESSION: 1. Normal LV systolic function, EF 62%. Morphology of the LV was consistent with apical hypertrophic cardiomyopathy. 2. LGE pattern consistent with apical hypertrophic cardiomyopathy.   Echo 4/16 Mild LVH, EF 60-65, indeterminate diastolic function, focal apical hypertrophy, MAC   LE arterial duplex/ABIs 4/16 Diffuse plaque throughout the left lower extremity, with mildly elevated velocities in the CFA. Widely patent left SFA stent. Normal ABIs   Carotid US 4/16 Bilateral ICA 1-39 FU 2 years   Myocardial Perfusion Imaging Study 11/12 Septal thinning, no ischemia, EF 54   LHC 12/09 LAD proximal 30 LCx with OM1 ostial 30 EF 65   Labs/Other Tests and Data Reviewed:    EKG:  No ECG reviewed.  Recent Labs: No results found for requested labs within last 8760 hours.        Wt  Readings from Last 3 Encounters:  04/01/19 152 lb (68.9 kg)  03/30/18 155 lb 12.8 oz (70.7 kg)  12/12/17 152 lb 8 oz (69.2 kg)     Objective:    Vital Signs:  BP (!) 146/74   Pulse (!) 52   Ht _0  (1.676 m)   Wt 152 lb (68.9 kg)   BMI 24.53 kg/m    VITAL SIGNS:  reviewed GEN:  no acute distress RESPIRATORY:  no labored breathing noted during our conversation NEURO:  alert and oriented PSYCH:  he seems to be in good spirits  ASSESSMENT & PLAN:    Hypertensive heart disease without CHF BP elevated today.  He notes his BP is mainly 291-916O systolic. Continue current management.  I have asked him to collect some BP readings and send those to me for review.    Apical variant hypertrophic cardiomyopathy (HCC) Asymptomatic.  Normal EF by Echo in 2019.  Continue beta-blocker.  Non-obstructive CAD by Cardiac Cath in 2009 He is not having angina.  Continue Clopidogrel, statin.  PAD (peripheral artery disease) (HCC) Hx of L SFA stenting.  ABIs in 2018 were normal.  He has some L leg claudication with extreme activity.  These  symptoms are stable without change.  Continue clopidogrel, statin.  Bilateral carotid artery disease, unspecified type ([place])   - Plan: VAS US CAROTID in the next 6-12 mos  Tobacco abuse We discussed the importance of quitting today.  COVID-19 Education: The signs and symptoms of COVID-19 were discussed with the patient and how to seek care for testing (follow up with PCP or arrange E-visit).   The importance of social distancing was discussed today.  Time:   Today, I have spent 11 minutes with the patient with telehealth technology discussing the above problems.     Medication Adjustments/Labs and Tests Ordered: Current medicines are reviewed at length with the patient today.  Concerns regarding medicines are outlined above.   Tests Ordered: No orders of the defined types were placed in this encounter.   Medication Changes: No orders of the defined types were placed in this encounter.   Disposition:  Follow up in 1 year(s)  Signed, Richardson Dopp, PA-C  04/01/2019 9:34 AM    [facility]

## 2019-04-01 ENCOUNTER — Other Ambulatory Visit: Payer: Self-pay

## 2019-04-01 ENCOUNTER — Telehealth (INDEPENDENT_AMBULATORY_CARE_PROVIDER_SITE_OTHER): Payer: Medicare HMO | Admitting: Physician Assistant

## 2019-04-01 VITALS — BP 146/74 | HR 52 | Ht 66.0 in | Wt 152.0 lb

## 2019-04-01 DIAGNOSIS — Z7189 Other specified counseling: Secondary | ICD-10-CM

## 2019-04-01 DIAGNOSIS — Z720 Tobacco use: Secondary | ICD-10-CM

## 2019-04-01 DIAGNOSIS — I119 Hypertensive heart disease without heart failure: Secondary | ICD-10-CM

## 2019-04-01 DIAGNOSIS — I422 Other hypertrophic cardiomyopathy: Secondary | ICD-10-CM

## 2019-04-01 DIAGNOSIS — I2510 Atherosclerotic heart disease of native coronary artery without angina pectoris: Secondary | ICD-10-CM

## 2019-04-01 DIAGNOSIS — I739 Peripheral vascular disease, unspecified: Secondary | ICD-10-CM

## 2019-04-01 DIAGNOSIS — I779 Disorder of arteries and arterioles, unspecified: Secondary | ICD-10-CM

## 2019-04-01 NOTE — Patient Instructions (Signed)
Medication Instructions:  No changes.  If you need a refill on your cardiac medications before your next appointment, please call your pharmacy.   Lab work: None   If you have labs (blood work) drawn today and your tests are completely normal, you will receive your results only by: Marland Kitchen MyChart Message (if you have MyChart) OR . A paper copy in the mail If you have any lab test that is abnormal or we need to change your treatment, we will call you to review the results.  Testing/Procedures: We will try to arrange the Carotid Ultrasound (ultrasound on your neck arteries) in the next 6-12 mos.  Follow-Up: At [facility], you and your health needs are our priority.  As part of our continuing mission to provide you with exceptional heart care, we have created designated Provider Care Teams.  These Care Teams include your primary Cardiologist (physician) and Advanced Practice Providers (APPs -  Physician Assistants and Nurse Practitioners) who all work together to provide you with the care you need, when you need it. You will need a follow up appointment in:  12 months.  Please call our office 2 months in advance to schedule this appointment.  You may see Tonny Bollman, MD or Tereso Newcomer, PA-C   Any Other Special Instructions Will Be Listed Below (If Applicable).  Check your blood pressure 1-2 times a day and write them down.  Then, send me those readings through MyChart in 2-3 weeks.  If you see your blood pressure is 140/90 or higher most of the time, call us.

## 2019-04-02 ENCOUNTER — Other Ambulatory Visit: Payer: Self-pay | Admitting: *Deleted

## 2019-04-02 DIAGNOSIS — I739 Peripheral vascular disease, unspecified: Principal | ICD-10-CM

## 2019-04-02 DIAGNOSIS — I779 Disorder of arteries and arterioles, unspecified: Secondary | ICD-10-CM

## 2019-04-09 ENCOUNTER — Other Ambulatory Visit: Payer: Self-pay | Admitting: Cardiovascular Disease

## 2019-04-22 ENCOUNTER — Encounter (INDEPENDENT_AMBULATORY_CARE_PROVIDER_SITE_OTHER): Payer: Medicare HMO | Admitting: Ophthalmology

## 2019-04-22 ENCOUNTER — Other Ambulatory Visit: Payer: Self-pay

## 2019-04-22 ENCOUNTER — Encounter (INDEPENDENT_AMBULATORY_CARE_PROVIDER_SITE_OTHER): Payer: Self-pay

## 2019-04-25 ENCOUNTER — Encounter (INDEPENDENT_AMBULATORY_CARE_PROVIDER_SITE_OTHER): Payer: Medicare HMO | Admitting: Ophthalmology

## 2019-04-30 ENCOUNTER — Other Ambulatory Visit: Payer: Self-pay

## 2019-04-30 ENCOUNTER — Encounter (INDEPENDENT_AMBULATORY_CARE_PROVIDER_SITE_OTHER): Payer: Medicare HMO | Admitting: Ophthalmology

## 2019-04-30 DIAGNOSIS — I10 Essential (primary) hypertension: Secondary | ICD-10-CM | POA: Diagnosis not present

## 2019-04-30 DIAGNOSIS — H35033 Hypertensive retinopathy, bilateral: Secondary | ICD-10-CM | POA: Diagnosis not present

## 2019-04-30 DIAGNOSIS — H34831 Tributary (branch) retinal vein occlusion, right eye, with macular edema: Secondary | ICD-10-CM | POA: Diagnosis not present

## 2019-04-30 DIAGNOSIS — H43811 Vitreous degeneration, right eye: Secondary | ICD-10-CM | POA: Diagnosis not present

## 2019-05-02 ENCOUNTER — Other Ambulatory Visit: Payer: Self-pay | Admitting: Cardiovascular Disease

## 2019-05-13 ENCOUNTER — Other Ambulatory Visit: Payer: Self-pay | Admitting: Cardiovascular Disease

## 2019-05-27 ENCOUNTER — Encounter (INDEPENDENT_AMBULATORY_CARE_PROVIDER_SITE_OTHER): Payer: Medicare HMO | Admitting: Ophthalmology

## 2019-06-10 ENCOUNTER — Encounter (INDEPENDENT_AMBULATORY_CARE_PROVIDER_SITE_OTHER): Payer: Medicare HMO | Admitting: Ophthalmology

## 2019-07-08 ENCOUNTER — Encounter (INDEPENDENT_AMBULATORY_CARE_PROVIDER_SITE_OTHER): Payer: Medicare HMO | Admitting: Ophthalmology

## 2019-07-08 ENCOUNTER — Other Ambulatory Visit: Payer: Self-pay

## 2019-07-08 DIAGNOSIS — H43813 Vitreous degeneration, bilateral: Secondary | ICD-10-CM

## 2019-07-08 DIAGNOSIS — H34831 Tributary (branch) retinal vein occlusion, right eye, with macular edema: Secondary | ICD-10-CM

## 2019-07-08 DIAGNOSIS — H35033 Hypertensive retinopathy, bilateral: Secondary | ICD-10-CM | POA: Diagnosis not present

## 2019-07-08 DIAGNOSIS — I10 Essential (primary) hypertension: Secondary | ICD-10-CM | POA: Diagnosis not present

## 2019-08-06 ENCOUNTER — Encounter (INDEPENDENT_AMBULATORY_CARE_PROVIDER_SITE_OTHER): Payer: Medicare HMO | Admitting: Ophthalmology

## 2019-08-06 ENCOUNTER — Other Ambulatory Visit: Payer: Self-pay

## 2019-08-06 DIAGNOSIS — I10 Essential (primary) hypertension: Secondary | ICD-10-CM | POA: Diagnosis not present

## 2019-08-06 DIAGNOSIS — H43813 Vitreous degeneration, bilateral: Secondary | ICD-10-CM

## 2019-08-06 DIAGNOSIS — H34831 Tributary (branch) retinal vein occlusion, right eye, with macular edema: Secondary | ICD-10-CM | POA: Diagnosis not present

## 2019-08-06 DIAGNOSIS — H35033 Hypertensive retinopathy, bilateral: Secondary | ICD-10-CM | POA: Diagnosis not present

## 2019-09-03 ENCOUNTER — Encounter (INDEPENDENT_AMBULATORY_CARE_PROVIDER_SITE_OTHER): Payer: Medicare HMO | Admitting: Ophthalmology

## 2019-09-18 ENCOUNTER — Other Ambulatory Visit: Payer: Self-pay

## 2019-09-18 ENCOUNTER — Encounter (INDEPENDENT_AMBULATORY_CARE_PROVIDER_SITE_OTHER): Payer: Medicare HMO | Admitting: Ophthalmology

## 2019-09-18 DIAGNOSIS — H35033 Hypertensive retinopathy, bilateral: Secondary | ICD-10-CM

## 2019-09-18 DIAGNOSIS — I10 Essential (primary) hypertension: Secondary | ICD-10-CM

## 2019-09-18 DIAGNOSIS — H43811 Vitreous degeneration, right eye: Secondary | ICD-10-CM | POA: Diagnosis not present

## 2019-09-18 DIAGNOSIS — H34831 Tributary (branch) retinal vein occlusion, right eye, with macular edema: Secondary | ICD-10-CM | POA: Diagnosis not present

## 2019-09-20 ENCOUNTER — Other Ambulatory Visit: Payer: Self-pay | Admitting: Cardiovascular Disease

## 2019-09-20 ENCOUNTER — Other Ambulatory Visit: Payer: Self-pay | Admitting: Physician Assistant

## 2019-09-20 DIAGNOSIS — I10 Essential (primary) hypertension: Secondary | ICD-10-CM

## 2019-09-20 DIAGNOSIS — I422 Other hypertrophic cardiomyopathy: Secondary | ICD-10-CM

## 2019-10-16 ENCOUNTER — Encounter (INDEPENDENT_AMBULATORY_CARE_PROVIDER_SITE_OTHER): Payer: Medicare HMO | Admitting: Ophthalmology

## 2019-10-17 ENCOUNTER — Inpatient Hospital Stay (HOSPITAL_COMMUNITY): Admission: RE | Admit: 2019-10-17 | Payer: Medicare HMO | Source: Ambulatory Visit

## 2019-10-23 ENCOUNTER — Encounter (INDEPENDENT_AMBULATORY_CARE_PROVIDER_SITE_OTHER): Payer: Medicare HMO | Admitting: Ophthalmology

## 2019-10-23 DIAGNOSIS — H43811 Vitreous degeneration, right eye: Secondary | ICD-10-CM

## 2019-10-23 DIAGNOSIS — H35033 Hypertensive retinopathy, bilateral: Secondary | ICD-10-CM

## 2019-10-23 DIAGNOSIS — H34831 Tributary (branch) retinal vein occlusion, right eye, with macular edema: Secondary | ICD-10-CM

## 2019-10-23 DIAGNOSIS — I10 Essential (primary) hypertension: Secondary | ICD-10-CM | POA: Diagnosis not present

## 2019-10-25 ENCOUNTER — Inpatient Hospital Stay (HOSPITAL_COMMUNITY): Admission: RE | Admit: 2019-10-25 | Payer: Medicare HMO | Source: Ambulatory Visit

## 2019-11-07 ENCOUNTER — Ambulatory Visit (HOSPITAL_COMMUNITY): Admission: RE | Admit: 2019-11-07 | Payer: Medicare HMO | Source: Ambulatory Visit

## 2019-11-20 ENCOUNTER — Encounter (INDEPENDENT_AMBULATORY_CARE_PROVIDER_SITE_OTHER): Payer: Medicare HMO | Admitting: Ophthalmology

## 2019-11-26 ENCOUNTER — Other Ambulatory Visit: Payer: Self-pay

## 2019-11-26 ENCOUNTER — Encounter (INDEPENDENT_AMBULATORY_CARE_PROVIDER_SITE_OTHER): Payer: Medicare HMO | Admitting: Ophthalmology

## 2019-11-26 DIAGNOSIS — H34831 Tributary (branch) retinal vein occlusion, right eye, with macular edema: Secondary | ICD-10-CM | POA: Diagnosis not present

## 2019-11-26 DIAGNOSIS — H43813 Vitreous degeneration, bilateral: Secondary | ICD-10-CM

## 2019-11-26 DIAGNOSIS — I10 Essential (primary) hypertension: Secondary | ICD-10-CM | POA: Diagnosis not present

## 2019-11-26 DIAGNOSIS — H35033 Hypertensive retinopathy, bilateral: Secondary | ICD-10-CM

## 2019-12-12 ENCOUNTER — Encounter (HOSPITAL_COMMUNITY): Payer: Medicare HMO

## 2019-12-19 ENCOUNTER — Other Ambulatory Visit: Payer: Self-pay | Admitting: Physician Assistant

## 2019-12-19 ENCOUNTER — Other Ambulatory Visit: Payer: Self-pay | Admitting: Cardiovascular Disease

## 2019-12-19 DIAGNOSIS — I422 Other hypertrophic cardiomyopathy: Secondary | ICD-10-CM

## 2019-12-19 DIAGNOSIS — I10 Essential (primary) hypertension: Secondary | ICD-10-CM

## 2019-12-23 ENCOUNTER — Encounter (INDEPENDENT_AMBULATORY_CARE_PROVIDER_SITE_OTHER): Payer: Medicare HMO | Admitting: Ophthalmology

## 2020-01-01 ENCOUNTER — Ambulatory Visit (HOSPITAL_COMMUNITY)
Admission: RE | Admit: 2020-01-01 | Payer: Medicare HMO | Source: Ambulatory Visit | Attending: Physician Assistant | Admitting: Physician Assistant

## 2020-01-07 ENCOUNTER — Encounter (INDEPENDENT_AMBULATORY_CARE_PROVIDER_SITE_OTHER): Payer: Medicare HMO | Admitting: Ophthalmology

## 2020-01-09 ENCOUNTER — Encounter (INDEPENDENT_AMBULATORY_CARE_PROVIDER_SITE_OTHER): Payer: Medicare HMO | Admitting: Ophthalmology

## 2020-01-09 ENCOUNTER — Other Ambulatory Visit: Payer: Self-pay

## 2020-01-09 DIAGNOSIS — I10 Essential (primary) hypertension: Secondary | ICD-10-CM

## 2020-01-09 DIAGNOSIS — H43813 Vitreous degeneration, bilateral: Secondary | ICD-10-CM

## 2020-01-09 DIAGNOSIS — H35033 Hypertensive retinopathy, bilateral: Secondary | ICD-10-CM | POA: Diagnosis not present

## 2020-01-09 DIAGNOSIS — H34831 Tributary (branch) retinal vein occlusion, right eye, with macular edema: Secondary | ICD-10-CM

## 2020-01-17 ENCOUNTER — Ambulatory Visit (HOSPITAL_COMMUNITY)
Admission: RE | Admit: 2020-01-17 | Payer: Medicare HMO | Source: Ambulatory Visit | Attending: Physician Assistant | Admitting: Physician Assistant

## 2020-02-06 ENCOUNTER — Encounter (INDEPENDENT_AMBULATORY_CARE_PROVIDER_SITE_OTHER): Payer: Medicare HMO | Admitting: Ophthalmology

## 2020-02-20 ENCOUNTER — Encounter (INDEPENDENT_AMBULATORY_CARE_PROVIDER_SITE_OTHER): Payer: Medicare HMO | Admitting: Ophthalmology

## 2020-02-24 ENCOUNTER — Encounter (INDEPENDENT_AMBULATORY_CARE_PROVIDER_SITE_OTHER): Payer: Medicare HMO | Admitting: Ophthalmology

## 2020-02-26 ENCOUNTER — Other Ambulatory Visit: Payer: Self-pay

## 2020-02-26 MED ORDER — AMLODIPINE BESYLATE 10 MG PO TABS: 10.0000 mg | ORAL_TABLET | Freq: Every day | ORAL | 0 refills | Status: DC

## 2020-03-14 ENCOUNTER — Other Ambulatory Visit: Payer: Self-pay | Admitting: Cardiology

## 2020-03-14 ENCOUNTER — Other Ambulatory Visit: Payer: Self-pay | Admitting: Cardiovascular Disease

## 2020-03-14 ENCOUNTER — Other Ambulatory Visit: Payer: Self-pay | Admitting: Physician Assistant

## 2020-03-14 DIAGNOSIS — I10 Essential (primary) hypertension: Secondary | ICD-10-CM

## 2020-03-14 DIAGNOSIS — I422 Other hypertrophic cardiomyopathy: Secondary | ICD-10-CM

## 2020-03-16 ENCOUNTER — Other Ambulatory Visit: Payer: Self-pay | Admitting: Cardiovascular Disease

## 2020-03-16 ENCOUNTER — Other Ambulatory Visit: Payer: Self-pay

## 2020-03-16 DIAGNOSIS — I422 Other hypertrophic cardiomyopathy: Secondary | ICD-10-CM

## 2020-03-16 DIAGNOSIS — I10 Essential (primary) hypertension: Secondary | ICD-10-CM

## 2020-03-16 MED ORDER — CARVEDILOL 6.25 MG PO TABS: ORAL_TABLET | ORAL | 0 refills | Status: DC

## 2020-03-16 MED ORDER — PRAVASTATIN SODIUM 80 MG PO TABS: ORAL_TABLET | ORAL | 0 refills | Status: DC

## 2020-03-16 MED ORDER — LISINOPRIL 40 MG PO TABS: 40.0000 mg | ORAL_TABLET | Freq: Every day | ORAL | 0 refills | Status: DC

## 2020-04-08 ENCOUNTER — Other Ambulatory Visit: Payer: Self-pay | Admitting: Cardiovascular Disease

## 2020-04-09 ENCOUNTER — Encounter (INDEPENDENT_AMBULATORY_CARE_PROVIDER_SITE_OTHER): Payer: Medicare HMO | Admitting: Ophthalmology

## 2020-04-09 DIAGNOSIS — H35033 Hypertensive retinopathy, bilateral: Secondary | ICD-10-CM | POA: Diagnosis not present

## 2020-04-09 DIAGNOSIS — I10 Essential (primary) hypertension: Secondary | ICD-10-CM

## 2020-04-09 DIAGNOSIS — H43813 Vitreous degeneration, bilateral: Secondary | ICD-10-CM

## 2020-04-09 DIAGNOSIS — H34831 Tributary (branch) retinal vein occlusion, right eye, with macular edema: Secondary | ICD-10-CM | POA: Diagnosis not present

## 2020-04-09 MED ORDER — PRAVASTATIN SODIUM 80 MG PO TABS: ORAL_TABLET | ORAL | 0 refills | Status: DC

## 2020-04-09 NOTE — Addendum Note (Signed)
Addended by: Melanee Spry on: 04/09/2020 03:29 PM   Modules accepted: Orders

## 2020-04-13 ENCOUNTER — Other Ambulatory Visit: Payer: Self-pay | Admitting: Cardiovascular Disease

## 2020-04-13 DIAGNOSIS — I10 Essential (primary) hypertension: Secondary | ICD-10-CM

## 2020-04-13 DIAGNOSIS — I422 Other hypertrophic cardiomyopathy: Secondary | ICD-10-CM

## 2020-05-07 ENCOUNTER — Encounter (INDEPENDENT_AMBULATORY_CARE_PROVIDER_SITE_OTHER): Payer: Medicare HMO | Admitting: Ophthalmology

## 2020-05-08 ENCOUNTER — Other Ambulatory Visit: Payer: Self-pay | Admitting: Cardiovascular Disease

## 2020-05-11 ENCOUNTER — Other Ambulatory Visit: Payer: Self-pay | Admitting: Physician Assistant

## 2020-05-11 NOTE — Telephone Encounter (Signed)
Patient schedule his appt for 7/16.

## 2020-05-13 ENCOUNTER — Encounter (INDEPENDENT_AMBULATORY_CARE_PROVIDER_SITE_OTHER): Payer: Medicare HMO | Admitting: Ophthalmology

## 2020-05-13 ENCOUNTER — Other Ambulatory Visit: Payer: Self-pay

## 2020-05-13 DIAGNOSIS — I10 Essential (primary) hypertension: Secondary | ICD-10-CM | POA: Diagnosis not present

## 2020-05-13 DIAGNOSIS — H34831 Tributary (branch) retinal vein occlusion, right eye, with macular edema: Secondary | ICD-10-CM | POA: Diagnosis not present

## 2020-05-13 DIAGNOSIS — H43813 Vitreous degeneration, bilateral: Secondary | ICD-10-CM

## 2020-05-13 DIAGNOSIS — H35033 Hypertensive retinopathy, bilateral: Secondary | ICD-10-CM

## 2020-05-18 ENCOUNTER — Other Ambulatory Visit: Payer: Self-pay | Admitting: Cardiovascular Disease

## 2020-05-18 DIAGNOSIS — I10 Essential (primary) hypertension: Secondary | ICD-10-CM

## 2020-05-18 DIAGNOSIS — I422 Other hypertrophic cardiomyopathy: Secondary | ICD-10-CM

## 2020-05-19 ENCOUNTER — Other Ambulatory Visit: Payer: Self-pay

## 2020-05-19 MED ORDER — PRAVASTATIN SODIUM 80 MG PO TABS: ORAL_TABLET | ORAL | 0 refills | Status: DC

## 2020-05-26 ENCOUNTER — Other Ambulatory Visit: Payer: Self-pay | Admitting: Physician Assistant

## 2020-06-06 ENCOUNTER — Other Ambulatory Visit: Payer: Self-pay | Admitting: Physician Assistant

## 2020-06-10 ENCOUNTER — Encounter (INDEPENDENT_AMBULATORY_CARE_PROVIDER_SITE_OTHER): Payer: Medicare HMO | Admitting: Ophthalmology

## 2020-06-11 ENCOUNTER — Other Ambulatory Visit: Payer: Self-pay | Admitting: Physician Assistant

## 2020-06-16 ENCOUNTER — Encounter (INDEPENDENT_AMBULATORY_CARE_PROVIDER_SITE_OTHER): Payer: Medicare HMO | Admitting: Ophthalmology

## 2020-06-16 ENCOUNTER — Other Ambulatory Visit: Payer: Self-pay

## 2020-06-16 DIAGNOSIS — H43813 Vitreous degeneration, bilateral: Secondary | ICD-10-CM | POA: Diagnosis not present

## 2020-06-16 DIAGNOSIS — I10 Essential (primary) hypertension: Secondary | ICD-10-CM | POA: Diagnosis not present

## 2020-06-16 DIAGNOSIS — H34831 Tributary (branch) retinal vein occlusion, right eye, with macular edema: Secondary | ICD-10-CM

## 2020-06-16 DIAGNOSIS — H35033 Hypertensive retinopathy, bilateral: Secondary | ICD-10-CM

## 2020-06-18 NOTE — Progress Notes (Deleted)
Cardiology Office Note:    Date:  06/18/2020   ID:  David Pacheco, David Pacheco [birth year], MRN [id number]  PCP:  Eber Hong, MD  Cardiologist:  Sherren Mocha, MD *** Electrophysiologist:  None   Referring MD: Eber Hong, MD   Chief Complaint:  No chief complaint on file.    Patient Profile:    David Pacheco is a 79 y.o. male with:   Apical variant hypertrophic cardiomyopathy  Echo 4/19: Apical hypertrophy, EF 60-65, GRII DD  Peripheral arterial disease  S/p L SFA stenting  Coronary artery disease  Mild nonobstructive by cath 12/09  Myoview 11/12: No ischemia  Carotid stenosis  [place] 9/18: <50% ICA bilaterally  Hypertension  Hyperlipidemia  History of DVT  Prior CV studies: Echo 04/02/18 Mild conc LVH, apical hypertrophy, EF 60-65, no RWMA, Gr 2 DD, mild MAC, mild TR  Carotid US 09/01/2017 IMPRESSION: 1. Mild bilateral carotid bifurcation proximal ICA atherosclerotic vascular plaque. No flow limiting stenosis. Degree of stenosis less than 50% bilaterally. 2. Vertebrals are patent with antegrade flow. Repeat 1 year  ABIs/arterial US 09/01/2017 R 1.06/ L 0.98 IMPRESSION: Resting ABI the bilateral lower extremities within normal limits, with the Doppler waveform at the bilateral ankles demonstrating no significant arterial occlusive disease.  Cardiac MRI 5/16 IMPRESSION: 1. Normal LV systolic function, EF 02%. Morphology of the LV was consistent with apical hypertrophic cardiomyopathy. 2. LGE pattern consistent with apical hypertrophic cardiomyopathy.  Echo 4/16 Mild LVH, EF 60-65, indeterminate diastolic function, focal apical hypertrophy, MAC  LE arterial duplex/ABIs 4/16 Diffuse plaque throughout the left lower extremity, with mildly elevated velocities in the CFA. Widely patent left SFA stent. Normal ABIs  Carotid US 4/16 Bilateral ICA 1-39 FU 2 years  Myocardial Perfusion Imaging Study11/12 Septal thinning, no ischemia, EF 54  LHC  12/09 LAD proximal 30 LCx with OM1 ostial 30 EF 65  History of Present Illness:    David Pacheco was last seen via telemedicine in April 2020.  He returns for follow-up.  The DICTATELATER SmartLink is not supported in this context. ***   Past Medical History:  Diagnosis Date  . Apical variant hypertrophic cardiomyopathy ([place])    Echo 4/19: mod conc LVH, apical hypertrophy, EF 60-65, no RWMA, Gr 2 DD, MAC, mild TR  . CAD (coronary artery disease)    nonobstructive >> LHC 12/09: pLAD 30, oOM1 30, EF 65 // Nuc study 11/12 Septal thinning, no ischemia, EF 54  . Carotid artery disease ([place]) 02/14/2017   Carotid US 4/16: Bilateral ICA 1-39 >> FU 2 years //carotid US 9/18: Bilateral ICA <50% >> follow-up 1 year  . DVT (deep venous thrombosis) ([place]) 2010   LLE  . Dyslipidemia   . Essential hypertension   . PVD (peripheral vascular disease) ([place])    lower extremity PAD eith intermittent claudication //ABIs: R 1.06; L 0.98; Doppler waveforms demonstrate no significant arterial occlusive disease    Current Medications: No outpatient medications have been marked as taking for the 06/19/20 encounter (Appointment) with Richardson Dopp T, PA-C.     Allergies:   Patient has no known allergies.   Social History   Tobacco Use  . Smoking status: Current Every Day Smoker    Packs/day: 0.75    Years: 60.00    Pack years: 45.00    Types: Cigarettes  . Smokeless tobacco: Never Used  Vaping Use  . Vaping Use: Never used  Substance Use Topics  . Alcohol use: Yes    Comment: 12/12/2017 "may take  a drink q 2-3 months"  . Drug use: No     Family Hx: The patient's family history includes Coronary artery disease in his brother; Heart attack in his brother; Hyperlipidemia in his father; Stroke in his mother.  ROS   EKGs/Labs/Other Test Reviewed:    EKG:  EKG is *** ordered today.  The ekg ordered today demonstrates ***  Recent Labs: No results found for requested labs within last 8760 hours.    Recent Lipid Panel Lab Results  Component Value Date/Time   CHOL  12/02/2008 05:00 AM    146        ATP III CLASSIFICATION:  <200     mg/dL   Desirable  200-239  mg/dL   Borderline High  >=240    mg/dL   High   TRIG 89 12/02/2008 05:00 AM   HDL 22 (L) 12/02/2008 05:00 AM   CHOLHDL 6.6 12/02/2008 05:00 AM   LDLCALC (H) 12/02/2008 05:00 AM    106        Total Cholesterol/HDL:CHD Risk Coronary Heart Disease Risk Table                     Men   Women  1/2 Average Risk   3.4   3.3    Physical Exam:    VS:  There were no vitals taken for this visit.    Wt Readings from Last 3 Encounters:  04/01/19 152 lb (68.9 kg)  03/30/18 155 lb 12.8 oz (70.7 kg)  12/12/17 152 lb 8 oz (69.2 kg)     Physical Exam ***  ASSESSMENT & PLAN:    ***  Hypertensive heart disease without CHF BP elevated today.  He notes his BP is mainly 086-578I systolic. Continue current management.  I have asked him to collect some BP readings and send those to me for review.    Apical variant hypertrophic cardiomyopathy (HCC) Asymptomatic.  Normal EF by Echo in 2019.  Continue beta-blocker.  Non-obstructive CAD by Cardiac Cath in 2009 He is not having angina.  Continue Clopidogrel, statin.  PAD (peripheral artery disease) (HCC) Hx of L SFA stenting.  ABIs in 2018 were normal.  He has some L leg claudication with extreme activity.  These symptoms are stable without change.  Continue clopidogrel, statin.  Bilateral carotid artery disease, unspecified type ([place])              - Plan: VAS US CAROTID in the next 6-12 mos  Tobacco abuse We discussed the importance of quitting today.  Dispo:  No follow-ups on file.   Medication Adjustments/Labs and Tests Ordered: Current medicines are reviewed at length with the patient today.  Concerns regarding medicines are outlined above.  Tests Ordered: No orders of the defined types were placed in this encounter.  Medication Changes: No orders of the defined  types were placed in this encounter.   Signed, Richardson Dopp, PA-C  06/18/2020 4:08 PM    [facility] [place], [place], [place]  [address] Phone: [phone]; Fax: [phone]

## 2020-06-19 ENCOUNTER — Ambulatory Visit: Payer: Medicare HMO | Admitting: Physician Assistant

## 2020-06-23 ENCOUNTER — Other Ambulatory Visit: Payer: Self-pay | Admitting: Cardiovascular Disease

## 2020-06-24 ENCOUNTER — Other Ambulatory Visit: Payer: Self-pay | Admitting: Physician Assistant

## 2020-07-14 ENCOUNTER — Other Ambulatory Visit: Payer: Self-pay

## 2020-07-14 ENCOUNTER — Encounter (INDEPENDENT_AMBULATORY_CARE_PROVIDER_SITE_OTHER): Payer: Medicare HMO | Admitting: Ophthalmology

## 2020-07-14 DIAGNOSIS — H35033 Hypertensive retinopathy, bilateral: Secondary | ICD-10-CM | POA: Diagnosis not present

## 2020-07-14 DIAGNOSIS — H43813 Vitreous degeneration, bilateral: Secondary | ICD-10-CM | POA: Diagnosis not present

## 2020-07-14 DIAGNOSIS — I10 Essential (primary) hypertension: Secondary | ICD-10-CM

## 2020-07-14 DIAGNOSIS — H34831 Tributary (branch) retinal vein occlusion, right eye, with macular edema: Secondary | ICD-10-CM

## 2020-07-30 NOTE — Progress Notes (Signed)
Cardiology Office Note:    Date:  07/31/2020   ID:  David, Pacheco 1941/05/15, MRN [id number]  PCP:  Eber Hong, MD  Cardiologist:  Sherren Mocha, MD   Electrophysiologist:  None   Referring MD: Eber Hong, MD   Chief Complaint:  Follow-up (Apical Variant HCM)    Patient Profile:    David Pacheco is a 79 y.o. male with:   Apical variant HCM  cMRI 5/16: EF 72, apical HCM  Echocardiogram 4/19: EF 60-65, apical LVH  Peripheral arterial disease   S/p prior L SFA stenting  Carotid artery dz  [place] 9/18: Bilat ICA < 50%  Coronary artery disease (non-obs)  Cath 12/09: pLAD 30, oOM1 30  Hypertension   Hyperlipidemia   Hx of DVT  Tobacco use  Prior CV studies: Echo 04/02/18 Mild conc LVH, apical hypertrophy, EF 60-65, no RWMA, Gr 2 DD, mild MAC, mild TR  Carotid US 09/01/2017 IMPRESSION: 1. Mild bilateral carotid bifurcation proximal ICA atherosclerotic vascular plaque. No flow limiting stenosis. Degree of stenosis less than 50% bilaterally. 2. Vertebrals are patent with antegrade flow. Repeat 1 year  ABIs/arterial US 09/01/2017 R 1.06/ L 0.98 IMPRESSION: Resting ABI the bilateral lower extremities within normal limits, with the Doppler waveform at the bilateral ankles demonstrating no significant arterial occlusive disease.  Cardiac MRI 5/16 IMPRESSION: 1. Normal LV systolic function, EF 36%. Morphology of the LV was consistent with apical hypertrophic cardiomyopathy. 2. LGE pattern consistent with apical hypertrophic cardiomyopathy.  Echo 4/16 Mild LVH, EF 60-65, indeterminate diastolic function, focal apical hypertrophy, MAC  LE arterial duplex/ABIs 4/16 Diffuse plaque throughout the left lower extremity, with mildly elevated velocities in the CFA. Widely patent left SFA stent. Normal ABIs  Carotid US 4/16 Bilateral ICA 1-39 FU 2 years  Myocardial Perfusion Imaging Study11/12 Septal thinning, no ischemia, EF 54  LHC  12/09 LAD proximal 30 LCx with OM1 ostial 30 EF 65   History of Present Illness:    David Pacheco was last seen in 03/2019 via Telemedicine.  He returns for follow up.   He is here alone.  He has been doing well without chest pain, shortness of breath, syncope, near syncope, orthopnea, leg swelling.  He has no awareness that he is in atrial fibrillation.  He is not having any palpitations or fatigue.     Past Medical History:  Diagnosis Date  . Apical variant hypertrophic cardiomyopathy ([place])    Echo 4/19: mod conc LVH, apical hypertrophy, EF 60-65, no RWMA, Gr 2 DD, MAC, mild TR  . CAD (coronary artery disease)    nonobstructive >> LHC 12/09: pLAD 30, oOM1 30, EF 65 // Nuc study 11/12 Septal thinning, no ischemia, EF 54  . Carotid artery disease ([place]) 02/14/2017   Carotid US 4/16: Bilateral ICA 1-39 >> FU 2 years //carotid US 9/18: Bilateral ICA <50% >> follow-up 1 year  . DVT (deep venous thrombosis) ([place]) 2010   LLE  . Dyslipidemia   . Essential hypertension   . PVD (peripheral vascular disease) (HCC)    lower extremity PAD eith intermittent claudication //ABIs: R 1.06; L 0.98; Doppler waveforms demonstrate no significant arterial occlusive disease    Current Medications: Current Meds  Medication Sig  . albuterol (VENTOLIN HFA) 108 (90 Base) MCG/ACT inhaler INHALE 2 PUFFS BY MOUTH EVERY 6 HOURS AS NEEDED FOR SHORTNESS OF BREATH  . amLODipine (NORVASC) 10 MG tablet Take 1 tablet (10 mg total) by mouth daily. Please keep upcoming appt in July before  anymore refills. Thank you  . bacitracin-polymyxin b (POLYSPORIN) ophthalmic ointment Place 1 application into the left eye 3 (three) times daily. apply to eye every 12 hours while awake  . brimonidine (ALPHAGAN) 0.2 % ophthalmic solution Place 1 drop into the left eye 2 (two) times daily.  . Cholecalciferol (VITAMIN D PO) Take 1 capsule by mouth daily.   . dorzolamide (TRUSOPT) 2 % ophthalmic solution Place 1 drop into the left eye 3  (three) times daily.  Marland Kitchen gatifloxacin (ZYMAXID) 0.5 % SOLN Place 1 drop into the left eye 4 (four) times daily.  . pravastatin (PRAVACHOL) 80 MG tablet TAKE 1 TABLET BY MOUTH EVERY DAY, Please keep follow up visit to receive further refills. Thank you.  . [DISCONTINUED] carvedilol (COREG) 6.25 MG tablet TAKE 1 TABLET BY MOUTH TWICE DAILY WITH MEALS must keep upcoming appt for further refills  . [DISCONTINUED] clopidogrel (PLAVIX) 75 MG tablet TAKE 1 TABLET BY MOUTH EVERY DAY  . [DISCONTINUED] lisinopril (ZESTRIL) 40 MG tablet TAKE 1 TABLET BY MOUTH DAILY     Allergies:   Patient has no known allergies.   Social History   Tobacco Use  . Smoking status: Current Every Day Smoker    Packs/day: 0.75    Years: 60.00    Pack years: 45.00    Types: Cigarettes  . Smokeless tobacco: Never Used  Vaping Use  . Vaping Use: Never used  Substance Use Topics  . Alcohol use: Yes    Comment: 12/12/2017 "may take a drink q 2-3 months"  . Drug use: No     Family Hx: The patient's family history includes Coronary artery disease in his brother; Heart attack in his brother; Hyperlipidemia in his father; Stroke in his mother.  Review of Systems  Respiratory: Negative for hemoptysis.   Gastrointestinal: Negative for hematochezia and melena.  Genitourinary: Negative for hematuria.     EKGs/Labs/Other Test Reviewed:    EKG:  EKG is   ordered today.  The ekg ordered today demonstrates atrial fibrillation, HR 135, normal axis, LVH with repol abnormality, QTc 507  Recent Labs: No results found for requested labs within last 8760 hours.   Labs from PCP ([facility]) Personally Interpreted/Reviewed: 07/16/2019: Hgb 14.3, K 4, SCr 1.05, ALT 10, LDL 62.6, Trig 122, HDL 33, TC 120, TSH 6.18  07/17/20: LDL 62.6, Hgb 14.3, SCr 1.05, K 4, ALT 10, TSH 6.18  Recent Lipid Panel   Physical Exam:    VS:  BP 120/60   Pulse (!) 135   Ht _0  (1.676 m)   Wt 153 lb (69.4 kg)   SpO2 98%   BMI 24.69 kg/m     Wt  Readings from Last 3 Encounters:  07/31/20 153 lb (69.4 kg)  04/01/19 152 lb (68.9 kg)  03/30/18 155 lb 12.8 oz (70.7 kg)     Constitutional:      Appearance: Healthy appearance. Not in distress.  Neck:     Thyroid: No thyromegaly.     Vascular: JVD normal.  Pulmonary:     Effort: Pulmonary effort is normal.     Breath sounds: No wheezing. No rales.  Cardiovascular:     Tachycardia present. Irregularly irregular rhythm. Normal S1. Normal S2.     Murmurs: There is no murmur.  Edema:    Peripheral edema absent.  Abdominal:     Palpations: Abdomen is soft.  Skin:    General: Skin is warm and dry.  Neurological:     General: No focal  deficit present.     Mental Status: Alert and oriented to person, place and time.     Cranial Nerves: Cranial nerves are intact.      CHA2DS2-VASc Score = 5  The patient's score is based upon: CHF History: 1 HTN History: 1 Age : 2 Diabetes History: 0 Stroke History: 0 Vascular Disease History: 1 Gender: 0       ASSESSMENT & PLAN:    1. Atrial fibrillation with rapid ventricular response ([place]) He is asymptomatic.  His rate is uncontrolled.  CHA2DS2-VASc=5.  We discussed the pathophysiology of atrial fibrillation and the rationale for anticoagulation.  With how fast his HR is, we should attempt DCCV to restore normal sinus rhythm.  I will adjust his beta-blocker and see him back in a week.  I reviewed his case today with Dr. Burt Knack.  If his HR is no better and he remains asymptomatic, we will try to add diltiazem.  If he is becoming symptomatic and his heart rate remains elevated, will need to consider TEE-DCCV.  -DC Plavix  -Start Eliquis 5 mg twice daily   -Increase Carvedilol to 12.5 mg twice daily   -FU in 1 week with an ECG    2. Hypertensive heart disease without CHF BP is controlled on current regimen. I will reduce his Lisinopril to 20 mg once daily to avoid hypotension with the adjustment in his Carvedilol.    3. Apical variant  hypertrophic cardiomyopathy (HCC) Asymptomatic. Continue beta-blocker.  Once he is back in sinus rhythm, will need to arrange an echocardiogram.  4. Bilateral carotid artery disease, unspecified type ([place]) We will need to arrange repeat US at some point in the near future.    5. Non-obstructive CAD by Cardiac Cath in 2009 No angina.  Continue statin Rx.     Dispo:  Return in about 1 week (around 08/07/2020) for Close Follow Up, w/ Richardson Dopp, PA-C, in person.   Medication Adjustments/Labs and Tests Ordered: Current medicines are reviewed at length with the patient today.  Concerns regarding medicines are outlined above.  Tests Ordered: Orders Placed This Encounter  Procedures  . EKG 12-Lead   Medication Changes: Meds ordered this encounter  Medications  . apixaban (ELIQUIS) 5 MG TABS tablet    Sig: Take 1 tablet (5 mg total) by mouth 2 (two) times daily.    Dispense:  60 tablet    Refill:  6  . carvedilol (COREG) 12.5 MG tablet    Sig: Take 1 tablet (12.5 mg total) by mouth 2 (two) times daily.    Dispense:  180 tablet    Refill:  1  . lisinopril (ZESTRIL) 20 MG tablet    Sig: Take 1 tablet (20 mg total) by mouth daily.    Dispense:  90 tablet    Refill:  1    Signed, Richardson Dopp, PA-C  07/31/2020 11:24 AM    [facility] [place], [place], [place]  [address] Phone: [phone]; Fax: [phone]

## 2020-07-31 ENCOUNTER — Ambulatory Visit: Payer: Medicare HMO | Admitting: Physician Assistant

## 2020-07-31 ENCOUNTER — Encounter: Payer: Self-pay | Admitting: Physician Assistant

## 2020-07-31 ENCOUNTER — Other Ambulatory Visit: Payer: Self-pay

## 2020-07-31 VITALS — BP 120/60 | HR 135 | Ht 66.0 in | Wt 153.0 lb

## 2020-07-31 DIAGNOSIS — I4891 Unspecified atrial fibrillation: Secondary | ICD-10-CM

## 2020-07-31 DIAGNOSIS — I422 Other hypertrophic cardiomyopathy: Secondary | ICD-10-CM

## 2020-07-31 DIAGNOSIS — I119 Hypertensive heart disease without heart failure: Secondary | ICD-10-CM | POA: Diagnosis not present

## 2020-07-31 DIAGNOSIS — I2510 Atherosclerotic heart disease of native coronary artery without angina pectoris: Secondary | ICD-10-CM

## 2020-07-31 DIAGNOSIS — I779 Disorder of arteries and arterioles, unspecified: Secondary | ICD-10-CM

## 2020-07-31 MED ORDER — APIXABAN 5 MG PO TABS: 5.0000 mg | ORAL_TABLET | Freq: Two times a day (BID) | ORAL | 6 refills | Status: DC

## 2020-07-31 MED ORDER — CARVEDILOL 12.5 MG PO TABS
12.5000 mg | ORAL_TABLET | Freq: Two times a day (BID) | ORAL | 1 refills | Status: DC
Start: 2020-07-31 — End: 2020-10-01

## 2020-07-31 MED ORDER — LISINOPRIL 20 MG PO TABS: 20.0000 mg | ORAL_TABLET | Freq: Every day | ORAL | 1 refills | Status: DC

## 2020-07-31 NOTE — Patient Instructions (Addendum)
Medication Instructions:  Your physician has recommended you make the following change in your medication:   1) Stop Plavix 75 mg 2) Start Eliquis 5 mg, 1 tablet by mouth twice a day 3) Increase Coreg to 12.5 mg, 1 tablet by mouth twice a day 3) Decrease Lisinopril to 20 mg, 1 tablet by mouth once a day  *If you need a refill on your cardiac medications before your next appointment, please call your pharmacy*  Lab Work: None ordered today  Testing/Procedures: None ordered today  Follow-Up: On 08/11/20 at 2:15PM with Tereso Newcomer, PA-C

## 2020-08-11 ENCOUNTER — Ambulatory Visit: Payer: Medicare HMO | Admitting: Physician Assistant

## 2020-08-11 ENCOUNTER — Other Ambulatory Visit: Payer: Self-pay

## 2020-08-11 ENCOUNTER — Other Ambulatory Visit: Payer: Self-pay | Admitting: Physician Assistant

## 2020-08-11 ENCOUNTER — Encounter (INDEPENDENT_AMBULATORY_CARE_PROVIDER_SITE_OTHER): Payer: Medicare HMO | Admitting: Ophthalmology

## 2020-08-11 DIAGNOSIS — I10 Essential (primary) hypertension: Secondary | ICD-10-CM | POA: Diagnosis not present

## 2020-08-11 DIAGNOSIS — H43813 Vitreous degeneration, bilateral: Secondary | ICD-10-CM

## 2020-08-11 DIAGNOSIS — H34831 Tributary (branch) retinal vein occlusion, right eye, with macular edema: Secondary | ICD-10-CM

## 2020-08-11 DIAGNOSIS — H35033 Hypertensive retinopathy, bilateral: Secondary | ICD-10-CM | POA: Diagnosis not present

## 2020-08-13 NOTE — Progress Notes (Addendum)
Cardiology Office Note:    Date:  08/14/2020   ID:  Cruz, Bong 03-17-1941, MRN [id number]  PCP:  Eber Hong, MD  Cardiologist:  Sherren Mocha, MD   Electrophysiologist:  None   Referring MD: Eber Hong, MD   Chief Complaint:   Follow-up (Atrial fibrillation)    Patient Profile:    David Pacheco is a 79 y.o. male with:   Apical variant HCM  cMRI 5/16: EF 72, apical HCM  Echocardiogram 4/19: EF 60-65, apical LVH  Peripheral arterial disease   S/p prior L SFA stenting  Carotid artery dz  [place] 9/18: Bilat ICA < 50%  Coronary artery disease (non-obs)  Cath 12/09: pLAD 30, oOM1 30  Hypertension   Hyperlipidemia   Hx of DVT  Tobacco use  Prior CV studies: Echo 04/02/18 Mild conc LVH, apical hypertrophy, EF 60-65, no RWMA, Gr 2 DD, mild MAC, mild TR  Carotid US 09/01/2017 IMPRESSION: 1. Mild bilateral carotid bifurcation proximal ICA atherosclerotic vascular plaque. No flow limiting stenosis. Degree of stenosis less than 50% bilaterally. 2. Vertebrals are patent with antegrade flow. Repeat 1 year  ABIs/arterial US 09/01/2017 R 1.06/ L 0.98 IMPRESSION: Resting ABI the bilateral lower extremities within normal limits, with the Doppler waveform at the bilateral ankles demonstrating no significant arterial occlusive disease.  Cardiac MRI 5/16 IMPRESSION: 1. Normal LV systolic function, EF 28%. Morphology of the LV was consistent with apical hypertrophic cardiomyopathy. 2. LGE pattern consistent with apical hypertrophic cardiomyopathy.  Echo 4/16 Mild LVH, EF 60-65, indeterminate diastolic function, focal apical hypertrophy, MAC  LE arterial duplex/ABIs 4/16 Diffuse plaque throughout the left lower extremity, with mildly elevated velocities in the CFA. Widely patent left SFA stent. Normal ABIs  Carotid US 4/16 Bilateral ICA 1-39 FU 2 years  Myocardial Perfusion Imaging Study11/12 Septal thinning, no ischemia, EF 54  LHC  12/09 LAD proximal 30 LCx with OM1 ostial 30 EF 65   History of Present Illness:    Mr. [name] was last seen 07/31/20.  He was in new onset AFib with RVR.  He was asymptomatic.  I started him on Apixaban and increased his Carvedilol.  He returns for close follow up. He is here alone. He is back in sinus rhythm. He really has not had any symptoms of rapid palpitations. He has not had chest discomfort or significant shortness of breath. He has not had orthopnea, leg swelling or syncope. He does note some bruising on his arms but no significant bleeding.      Past Medical History:  Diagnosis Date  . Apical variant hypertrophic cardiomyopathy ([place])    Echo 4/19: mod conc LVH, apical hypertrophy, EF 60-65, no RWMA, Gr 2 DD, MAC, mild TR  . CAD (coronary artery disease)    nonobstructive >> LHC 12/09: pLAD 30, oOM1 30, EF 65 // Nuc study 11/12 Septal thinning, no ischemia, EF 54  . Carotid artery disease ([place]) 02/14/2017   Carotid US 4/16: Bilateral ICA 1-39 >> FU 2 years //carotid US 9/18: Bilateral ICA <50% >> follow-up 1 year  . DVT (deep venous thrombosis) ([place]) 2010   LLE  . Dyslipidemia   . Essential hypertension   . PVD (peripheral vascular disease) (HCC)    lower extremity PAD eith intermittent claudication //ABIs: R 1.06; L 0.98; Doppler waveforms demonstrate no significant arterial occlusive disease    Current Medications: Current Meds  Medication Sig  . albuterol (VENTOLIN HFA) 108 (90 Base) MCG/ACT inhaler INHALE 2 PUFFS BY MOUTH EVERY 6  HOURS AS NEEDED FOR SHORTNESS OF BREATH  . amLODipine (NORVASC) 10 MG tablet Take 1 tablet (10 mg total) by mouth daily. Please keep upcoming appt in July before anymore refills. Thank you  . apixaban (ELIQUIS) 5 MG TABS tablet Take 1 tablet (5 mg total) by mouth 2 (two) times daily.  . bacitracin-polymyxin b (POLYSPORIN) ophthalmic ointment Place 1 application into the left eye 3 (three) times daily. apply to eye every 12 hours while awake   . brimonidine (ALPHAGAN) 0.2 % ophthalmic solution Place 1 drop into the left eye 2 (two) times daily.  . carvedilol (COREG) 12.5 MG tablet Take 1 tablet (12.5 mg total) by mouth 2 (two) times daily.  . Cholecalciferol (VITAMIN D PO) Take 1 capsule by mouth daily.   . dorzolamide (TRUSOPT) 2 % ophthalmic solution Place 1 drop into the left eye 3 (three) times daily.  Marland Kitchen gatifloxacin (ZYMAXID) 0.5 % SOLN Place 1 drop into the left eye 4 (four) times daily.  Marland Kitchen lisinopril (ZESTRIL) 20 MG tablet Take 1 tablet (20 mg total) by mouth daily.  . pravastatin (PRAVACHOL) 80 MG tablet Take 1 tablet (80 mg total) by mouth daily. TAKE 1 TABLET BY MOUTH EVERY DAY     Allergies:   Patient has no known allergies.   Social History   Tobacco Use  . Smoking status: Current Every Day Smoker    Packs/day: 0.75    Years: 60.00    Pack years: 45.00    Types: Cigarettes  . Smokeless tobacco: Never Used  Vaping Use  . Vaping Use: Never used  Substance Use Topics  . Alcohol use: Yes    Comment: 12/12/2017 "may take a drink q 2-3 months"  . Drug use: No     Family Hx: The patient's family history includes Coronary artery disease in his brother; Heart attack in his brother; Hyperlipidemia in his father; Stroke in his mother.  Review of Systems  Gastrointestinal: Negative for hematochezia and melena.  Genitourinary: Negative for hematuria.     EKGs/Labs/Other Test Reviewed:    EKG:  EKG is  ordered today.  The ekg ordered today demonstrates sinus bradycardia, HR 58, normal axis, T wave inversions 1, 2, aVL, aVF, V2-V6, QTC 475, similar to prior tracings  Recent Labs: No results found for requested labs within last 8760 hours.   Labs from PCP ([facility]) Personally Interpreted/Reviewed: 07/16/2019: Hgb 14.3, K 4, SCr 1.05, ALT 10, LDL 62.6, Trig 122, HDL 33, TC 120, TSH 6.18  07/17/20: LDL 62.6, Hgb 14.3, SCr 1.05, K 4, ALT 10, TSH 6.18    Physical Exam:    VS:  BP (!) 130/50   Ht _0  (1.676 m)    Wt 156 lb (70.8 kg)   SpO2 98%   BMI 25.18 kg/m     Wt Readings from Last 3 Encounters:  08/14/20 156 lb (70.8 kg)  07/31/20 153 lb (69.4 kg)  04/01/19 152 lb (68.9 kg)     Constitutional:      Appearance: Healthy appearance. Not in distress.  Neck:     Vascular: JVD normal.  Pulmonary:     Effort: Pulmonary effort is normal.     Breath sounds: No wheezing. No rales.  Cardiovascular:     Normal rate. Regular rhythm. Normal S1. Normal S2.     Murmurs: There is no murmur.  Edema:    Peripheral edema absent.  Abdominal:     Palpations: Abdomen is soft.  Skin:    General:  Skin is warm and dry.  Neurological:     General: No focal deficit present.     Mental Status: Alert and oriented to person, place and time.     Cranial Nerves: Cranial nerves are intact.      CHA2DS2-VASc Score = 5  The patient's score is based upon: CHF History: 1 HTN History: 1 Age : 2 Diabetes History: 0 Stroke History: 0 Vascular Disease History: 1 Gender: 0     ASSESSMENT & PLAN:   1. Paroxysmal atrial fibrillation He is now back in sinus rhythm. He is asymptomatic. Continue Apixaban for stroke prophylaxis. Arrange follow-up BMET, CBC in 6 weeks. Continue current dose of carvedilol. I will arrange a 14-day ZIO monitor to assess A. fib burden. If he is having significant amounts of atrial fibrillation with high heart rates, we may need to consider antiarrhythmic therapy.  2. Hypertensive heart disease without CHF Blood pressure is well controlled. Continue current dose of amlodipine, carvedilol, lisinopril.  3. Apical variant hypertrophic cardiomyopathy ([place]) Continue beta-blocker therapy. Arrange follow-up echocardiogram given recent history of atrial fibrillation.  4. Sleepiness He does note daytime sleepiness. He now has a diagnosis of age fibrillation. I will arrange a home sleep study to assess for sleep apnea.     Dispo:  Return in about 3 months (around 11/13/2020) for Routine  Follow Up, w/ Dr. Burt Knack, or Richardson Dopp, PA-C, in person.   Medication Adjustments/Labs and Tests Ordered: Current medicines are reviewed at length with the patient today.  Concerns regarding medicines are outlined above.  Tests Ordered: Orders Placed This Encounter  Procedures  . Basic metabolic panel  . CBC  . LONG TERM MONITOR (3-14 DAYS)  . EKG 12-Lead  . ECHOCARDIOGRAM COMPLETE  . Home sleep test   Medication Changes: No orders of the defined types were placed in this encounter.   Signed, Richardson Dopp, PA-C  08/14/2020 12:41 PM    [facility] [place], [place], [place]  [address] Phone: [phone]; Fax: [phone]

## 2020-08-14 ENCOUNTER — Other Ambulatory Visit: Payer: Self-pay

## 2020-08-14 ENCOUNTER — Encounter: Payer: Self-pay | Admitting: Physician Assistant

## 2020-08-14 ENCOUNTER — Telehealth: Payer: Self-pay | Admitting: Radiology

## 2020-08-14 ENCOUNTER — Ambulatory Visit: Payer: Medicare HMO | Admitting: Physician Assistant

## 2020-08-14 VITALS — BP 130/50 | Ht 66.0 in | Wt 156.0 lb

## 2020-08-14 DIAGNOSIS — I4891 Unspecified atrial fibrillation: Secondary | ICD-10-CM | POA: Diagnosis not present

## 2020-08-14 DIAGNOSIS — I119 Hypertensive heart disease without heart failure: Secondary | ICD-10-CM

## 2020-08-14 DIAGNOSIS — I422 Other hypertrophic cardiomyopathy: Secondary | ICD-10-CM

## 2020-08-14 DIAGNOSIS — R400 Somnolence: Secondary | ICD-10-CM

## 2020-08-14 NOTE — Telephone Encounter (Signed)
Enrolled patient for a 14 day Zio XT  monitor to be mailed to patients home  °

## 2020-08-14 NOTE — Patient Instructions (Addendum)
Medication Instructions:  Your physician recommends that you continue on your current medications as directed. Please refer to the Current Medication list given to you today.  *If you need a refill on your cardiac medications before your next appointment, please call your pharmacy*  Lab Work: Your physician recommends that you return for lab work in 6 weeks on 09/25/20. **The lab is open from 7:30AM-4:30PM** You may come anytime between these hours.  If you have labs (blood work) drawn today and your tests are completely normal, you will receive your results only by: Marland Kitchen MyChart Message (if you have MyChart) OR . A paper copy in the mail If you have any lab test that is abnormal or we need to change your treatment, we will call you to review the results.   Testing/Procedures: Your physician has recommended that you have a home sleep study. This test records several body functions during sleep, including: brain activity, eye movement, oxygen and carbon dioxide blood levels, heart rate and rhythm, breathing rate and rhythm, the flow of air through your mouth and nose, snoring, body muscle movements, and chest and belly movement.  Your physician has requested that you have an echocardiogram. Echocardiography is a painless test that uses sound waves to create images of your heart. It provides your doctor with information about the size and shape of your heart and how well your heart's chambers and valves are working. This procedure takes approximately one hour. There are no restrictions for this procedure.  A zio monitor was ordered today. It will remain on for 14 days. You will then return monitor and event diary in provided box. It takes 1-2 weeks for report to be downloaded and returned to [place]. We will call you with the results. If monitor falls off or has orange flashing light, please call Zio for further instructions.   Follow-Up: On 11/18/20 at 12:15PM with Tereso Newcomer, PA-C

## 2020-08-19 ENCOUNTER — Ambulatory Visit (INDEPENDENT_AMBULATORY_CARE_PROVIDER_SITE_OTHER): Payer: Medicare HMO

## 2020-08-19 DIAGNOSIS — I4891 Unspecified atrial fibrillation: Secondary | ICD-10-CM

## 2020-09-07 ENCOUNTER — Encounter: Payer: Self-pay | Admitting: Physician Assistant

## 2020-09-08 ENCOUNTER — Other Ambulatory Visit: Payer: Self-pay

## 2020-09-08 ENCOUNTER — Encounter (INDEPENDENT_AMBULATORY_CARE_PROVIDER_SITE_OTHER): Payer: Medicare HMO | Admitting: Ophthalmology

## 2020-09-08 DIAGNOSIS — H34831 Tributary (branch) retinal vein occlusion, right eye, with macular edema: Secondary | ICD-10-CM | POA: Diagnosis not present

## 2020-09-08 DIAGNOSIS — H43811 Vitreous degeneration, right eye: Secondary | ICD-10-CM

## 2020-09-08 DIAGNOSIS — H35033 Hypertensive retinopathy, bilateral: Secondary | ICD-10-CM | POA: Diagnosis not present

## 2020-09-08 DIAGNOSIS — I10 Essential (primary) hypertension: Secondary | ICD-10-CM | POA: Diagnosis not present

## 2020-09-25 ENCOUNTER — Other Ambulatory Visit: Payer: Medicare HMO | Admitting: *Deleted

## 2020-09-25 ENCOUNTER — Ambulatory Visit (HOSPITAL_COMMUNITY): Payer: Medicare HMO | Attending: Cardiology

## 2020-09-25 ENCOUNTER — Encounter: Payer: Self-pay | Admitting: General Practice

## 2020-09-25 ENCOUNTER — Encounter: Payer: Self-pay | Admitting: Physician Assistant

## 2020-09-25 ENCOUNTER — Other Ambulatory Visit: Payer: Self-pay

## 2020-09-25 ENCOUNTER — Other Ambulatory Visit: Payer: Medicare HMO

## 2020-09-25 DIAGNOSIS — I422 Other hypertrophic cardiomyopathy: Secondary | ICD-10-CM | POA: Insufficient documentation

## 2020-09-25 DIAGNOSIS — I4891 Unspecified atrial fibrillation: Secondary | ICD-10-CM

## 2020-09-25 DIAGNOSIS — I119 Hypertensive heart disease without heart failure: Secondary | ICD-10-CM

## 2020-09-25 LAB — BASIC METABOLIC PANEL
BUN/Creatinine Ratio: 12 (ref 10–24)
BUN: 11 mg/dL (ref 8–27)
CO2: 21 mmol/L (ref 20–29)
Calcium: 8.9 mg/dL (ref 8.6–10.2)
Chloride: 110 mmol/L — ABNORMAL HIGH (ref 96–106)
Creatinine, Ser: 0.91 mg/dL (ref 0.76–1.27)
GFR calc Af Amer: 94 mL/min/{1.73_m2} (ref >59)
GFR calc non Af Amer: 81 mL/min/{1.73_m2} (ref >59)
Glucose: 102 mg/dL — ABNORMAL HIGH (ref 65–99)
Potassium: 4.1 mmol/L (ref 3.5–5.2)
Sodium: 144 mmol/L (ref 134–144)

## 2020-09-25 LAB — CBC
Hematocrit: 39.5 % (ref 37.5–51.0)
Hemoglobin: 13.1 g/dL (ref 13.0–17.7)
MCH: 32.8 pg (ref 26.6–33.0)
MCHC: 33.2 g/dL (ref 31.5–35.7)
MCV: 99 fL — ABNORMAL HIGH (ref 79–97)
Platelets: 289 10*3/uL (ref 150–450)
RBC: 3.99 x10E6/uL — ABNORMAL LOW (ref 4.14–5.80)
RDW: 14.1 % (ref 11.6–15.4)
WBC: 7.8 10*3/uL (ref 3.4–10.8)

## 2020-09-25 LAB — ECHOCARDIOGRAM COMPLETE
Area-P 1/2: 4.83 cm2
S' Lateral: 2.9 cm

## 2020-09-25 MED ORDER — PERFLUTREN LIPID MICROSPHERE
1.0000 mL | INTRAVENOUS | Status: AC | PRN
  Administered 2020-09-25: 1 mL via INTRAVENOUS

## 2020-10-01 ENCOUNTER — Other Ambulatory Visit: Payer: Self-pay | Admitting: Cardiovascular Disease

## 2020-10-01 ENCOUNTER — Other Ambulatory Visit: Payer: Self-pay

## 2020-10-01 MED ORDER — CARVEDILOL 12.5 MG PO TABS
12.5000 mg | ORAL_TABLET | Freq: Two times a day (BID) | ORAL | 3 refills | Status: DC
Start: 2020-10-01 — End: 2021-09-20

## 2020-10-01 NOTE — Telephone Encounter (Signed)
Pt's medication was sent to pt's pharmacy as requested. Confirmation received.  °

## 2020-10-06 ENCOUNTER — Encounter (INDEPENDENT_AMBULATORY_CARE_PROVIDER_SITE_OTHER): Payer: Medicare HMO | Admitting: Ophthalmology

## 2020-10-08 ENCOUNTER — Encounter (INDEPENDENT_AMBULATORY_CARE_PROVIDER_SITE_OTHER): Payer: Medicare HMO | Admitting: Ophthalmology

## 2020-10-08 ENCOUNTER — Other Ambulatory Visit: Payer: Self-pay

## 2020-10-08 DIAGNOSIS — H34831 Tributary (branch) retinal vein occlusion, right eye, with macular edema: Secondary | ICD-10-CM | POA: Diagnosis not present

## 2020-10-08 DIAGNOSIS — I10 Essential (primary) hypertension: Secondary | ICD-10-CM

## 2020-10-08 DIAGNOSIS — H35033 Hypertensive retinopathy, bilateral: Secondary | ICD-10-CM

## 2020-10-08 DIAGNOSIS — H43811 Vitreous degeneration, right eye: Secondary | ICD-10-CM

## 2020-11-05 ENCOUNTER — Encounter (INDEPENDENT_AMBULATORY_CARE_PROVIDER_SITE_OTHER): Payer: Medicare HMO | Admitting: Ophthalmology

## 2020-11-05 ENCOUNTER — Other Ambulatory Visit: Payer: Self-pay

## 2020-11-05 DIAGNOSIS — H35033 Hypertensive retinopathy, bilateral: Secondary | ICD-10-CM | POA: Diagnosis not present

## 2020-11-05 DIAGNOSIS — H34831 Tributary (branch) retinal vein occlusion, right eye, with macular edema: Secondary | ICD-10-CM | POA: Diagnosis not present

## 2020-11-05 DIAGNOSIS — I10 Essential (primary) hypertension: Secondary | ICD-10-CM | POA: Diagnosis not present

## 2020-11-05 DIAGNOSIS — H43813 Vitreous degeneration, bilateral: Secondary | ICD-10-CM | POA: Diagnosis not present

## 2020-11-12 ENCOUNTER — Other Ambulatory Visit: Payer: Self-pay

## 2020-11-12 ENCOUNTER — Encounter (INDEPENDENT_AMBULATORY_CARE_PROVIDER_SITE_OTHER): Payer: Medicare HMO | Admitting: Ophthalmology

## 2020-11-12 DIAGNOSIS — H34831 Tributary (branch) retinal vein occlusion, right eye, with macular edema: Secondary | ICD-10-CM | POA: Diagnosis not present

## 2020-11-18 ENCOUNTER — Ambulatory Visit: Payer: Medicare HMO | Admitting: Physician Assistant

## 2020-11-23 NOTE — Progress Notes (Deleted)
Cardiology Office Note:    Date:  11/23/2020   ID:  [name], [name] Oct 29, 1941, MRN [id number]  PCP:  Eber Hong, MD  [facility] Cardiologist:  Sherren Mocha, MD *** [facility] Electrophysiologist:  None   Referring MD: Eber Hong, MD   Chief Complaint:  No chief complaint on file.    Patient Profile:    David Pacheco is a 79 y.o. male with:   Apical variant HCM ? cMRI 5/16: EF 72, apical HCM ? Echocardiogram 4/19: EF 60-65, apical LVH  Paroxysmal AFib CHA2DS2-VASc Score = 5 [CHF History: Yes, HTN History: Yes, Diabetes History: No, Stroke History: No, Vascular Disease History: Yes].  Therefore, the patient's annual risk of stroke is 7.2 %.   >> Apixaban   Peripheral arterial disease  ? S/p prior L SFA stenting  Carotid artery dz ? [place] 9/18: Bilat ICA < 50%  Coronary artery disease (non-obs) ? Cath 12/09: pLAD 30, oOM1 30  Hypertension   Hyperlipidemia   Hx of DVT  Tobacco use  Prior CV studies: Echocardiogram 09/25/20 Apical HCM, EF 70-75, no RWMA, severe apical hypertrophy, Gr 2 DD, normal RVSF, trivial MR  Event monitor 09/2020 1. The basic rhythm is sinus bradycardia with an average HR of 53 bpm 2. No atrial fibrillation or flutter 3. No high-grade heart block or pathologic pauses 4. There are rare PVC's and frequent supraventricular beats (6%) without sustained arrhythmias 5. There are few short ventricular runs, longest 6 beats 6. There are few supraventricular runs, but none sustained  Echo 04/02/18 Mild conc LVH, apical hypertrophy, EF 60-65, no RWMA, Gr 2 DD, mild MAC, mild TR  Carotid US 09/01/2017 IMPRESSION: 1. Mild bilateral carotid bifurcation proximal ICA atherosclerotic vascular plaque. No flow limiting stenosis. Degree of stenosis less than 50% bilaterally. 2. Vertebrals are patent with antegrade flow. Repeat 1 year  ABIs/arterial US 09/01/2017 R 1.06/ L 0.98 IMPRESSION: Resting ABI the bilateral lower extremities  within normal limits, with the Doppler waveform at the bilateral ankles demonstrating no significant arterial occlusive disease.  Cardiac MRI 5/16 IMPRESSION: 1. Normal LV systolic function, EF 17%. Morphology of the LV was consistent with apical hypertrophic cardiomyopathy. 2. LGE pattern consistent with apical hypertrophic cardiomyopathy.  Echo 4/16 Mild LVH, EF 60-65, indeterminate diastolic function, focal apical hypertrophy, MAC  LE arterial duplex/ABIs 4/16 Diffuse plaque throughout the left lower extremity, with mildly elevated velocities in the CFA. Widely patent left SFA stent. Normal ABIs  Carotid US 4/16 Bilateral ICA 1-39 FU 2 years  Myocardial Perfusion Imaging Study11/12 Septal thinning, no ischemia, EF 54  LHC 12/09 LAD proximal 30 LCx with OM1 ostial 30 EF 65   History of Present Illness:    [name] was last seen in clinic in 9/21.  He was back in normal sinus rhythm.  I set him up for an echocardiogram and an event monitor to assess AFib burden.  He did not have enough atrial fibrillation to warrant AAD therapy.  His dose of beta-blocker was continued.  A f/u echocardiogram was stable with EF 70-75 and apical hypertrophy.  He return for f/u.  ***      Past Medical History:  Diagnosis Date  . Apical variant hypertrophic cardiomyopathy ([place])    Echo 4/19: mod conc LVH, apical hypertrophy, EF 60-65, no RWMA, Gr 2 DD, MAC, mild TR // Echo 10/21: Apical hypertrophic cardiomyopathy, EF 70-75, hyperdynamic function, no RWMA, GRII DD, normal RVSF, trivial MR  . CAD (coronary artery disease)  nonobstructive >> LHC 12/09: pLAD 30, oOM1 30, EF 65 // Nuc study 11/12 Septal thinning, no ischemia, EF 54  . Carotid artery disease ([place]) 02/14/2017   Carotid US 4/16: Bilateral ICA 1-39 >> FU 2 years //carotid US 9/18: Bilateral ICA <50% >> follow-up 1 year  . DVT (deep venous thrombosis) ([place]) 2010   LLE  . Dyslipidemia   . Essential hypertension   .  Paroxysmal atrial fibrillation (HCC)    noted on ECG during routine office visit // CHADS-VASc=5 // Apixaban Rx // Monitor 10/21: sinus brady, avg HR 53, rare PVCs, frequent supraventricular beats (6%), no sustained arrhythmia, few supraventricular runs and few short ventricular runs.   Marland Kitchen PVD (peripheral vascular disease) ([place])    lower extremity PAD eith intermittent claudication //ABIs: R 1.06; L 0.98; Doppler waveforms demonstrate no significant arterial occlusive disease    Current Medications: No outpatient medications have been marked as taking for the 11/24/20 encounter (Appointment) with Richardson Dopp T, PA-C.     Allergies:   Patient has no known allergies.   Social History   Tobacco Use  . Smoking status: Current Every Day Smoker    Packs/day: 0.75    Years: 60.00    Pack years: 45.00    Types: Cigarettes  . Smokeless tobacco: Never Used  Vaping Use  . Vaping Use: Never used  Substance Use Topics  . Alcohol use: Yes    Comment: 12/12/2017 "may take a drink q 2-3 months"  . Drug use: No     Family Hx: The patient's family history includes Coronary artery disease in his brother; Heart attack in his brother; Hyperlipidemia in his father; Stroke in his mother.  ROS   EKGs/Labs/Other Test Reviewed:    EKG:  EKG is *** ordered today.  The ekg ordered today demonstrates ***  Recent Labs: 09/25/2020: BUN 11; Creatinine, Ser 0.91; Hemoglobin 13.1; Platelets 289; Potassium 4.1; Sodium 144   Recent Lipid Panel Lab Results  Component Value Date/Time   CHOL  12/02/2008 05:00 AM    146        ATP III CLASSIFICATION:  <200     mg/dL   Desirable  200-239  mg/dL   Borderline High  >=240    mg/dL   High   TRIG 89 12/02/2008 05:00 AM   HDL 22 (L) 12/02/2008 05:00 AM   CHOLHDL 6.6 12/02/2008 05:00 AM   LDLCALC (H) 12/02/2008 05:00 AM    106        Total Cholesterol/HDL:CHD Risk Coronary Heart Disease Risk Table                     Men   Women  1/2 Average Risk   3.4    3.3      Risk Assessment/Calculations:   {Does this patient have ATRIAL FIBRILLATION?:[id number]}  Physical Exam:    VS:  There were no vitals taken for this visit.    Wt Readings from Last 3 Encounters:  08/14/20 156 lb (70.8 kg)  07/31/20 153 lb (69.4 kg)  04/01/19 152 lb (68.9 kg)     Physical Exam ***  ASSESSMENT & PLAN:    ***  1. Paroxysmal atrial fibrillation He is now back in sinus rhythm. He is asymptomatic. Continue Apixaban for stroke prophylaxis. Arrange follow-up BMET, CBC in 6 weeks. Continue current dose of carvedilol. I will arrange a 14-day ZIO monitor to assess A. fib burden. If he is having significant amounts of atrial fibrillation with high  heart rates, we may need to consider antiarrhythmic therapy.  2. Hypertensive heart disease without CHF Blood pressure is well controlled. Continue current dose of amlodipine, carvedilol, lisinopril.  3. Apical variant hypertrophic cardiomyopathy ([place]) Continue beta-blocker therapy. Arrange follow-up echocardiogram given recent history of atrial fibrillation.  4. Sleepiness He does note daytime sleepiness. He now has a diagnosis of age fibrillation. I will arrange a home sleep study to assess for sleep apnea. {Are you ordering a CV Procedure (e.g. stress test, cath, DCCV, TEE, etc)?   Press F2        :[id number]}    Dispo:  No follow-ups on file.   Medication Adjustments/Labs and Tests Ordered: Current medicines are reviewed at length with the patient today.  Concerns regarding medicines are outlined above.  Tests Ordered: No orders of the defined types were placed in this encounter.  Medication Changes: No orders of the defined types were placed in this encounter.   Signed, Richardson Dopp, PA-C  11/23/2020 10:43 PM    [facility] [place], [place], [place]  [address] Phone: [phone]; Fax: [phone]

## 2020-11-24 ENCOUNTER — Ambulatory Visit: Payer: Medicare HMO | Admitting: Physician Assistant

## 2020-12-08 ENCOUNTER — Other Ambulatory Visit: Payer: Self-pay | Admitting: Cardiovascular Disease

## 2020-12-09 ENCOUNTER — Other Ambulatory Visit: Payer: Self-pay | Admitting: Physician Assistant

## 2020-12-10 ENCOUNTER — Encounter (INDEPENDENT_AMBULATORY_CARE_PROVIDER_SITE_OTHER): Payer: Medicare HMO | Admitting: Ophthalmology

## 2020-12-10 MED ORDER — AMLODIPINE BESYLATE 10 MG PO TABS
10.0000 mg | ORAL_TABLET | Freq: Every day | ORAL | 2 refills | Status: DC
Start: 2020-12-10 — End: 2021-09-01

## 2020-12-10 NOTE — Addendum Note (Signed)
Addended by: Margaret Pyle D on: 12/10/2020 08:36 AM   Modules accepted: Orders

## 2020-12-14 ENCOUNTER — Other Ambulatory Visit: Payer: Self-pay

## 2020-12-14 ENCOUNTER — Encounter (INDEPENDENT_AMBULATORY_CARE_PROVIDER_SITE_OTHER): Payer: Medicare HMO | Admitting: Ophthalmology

## 2020-12-14 DIAGNOSIS — H35033 Hypertensive retinopathy, bilateral: Secondary | ICD-10-CM

## 2020-12-14 DIAGNOSIS — H34831 Tributary (branch) retinal vein occlusion, right eye, with macular edema: Secondary | ICD-10-CM

## 2020-12-14 DIAGNOSIS — I10 Essential (primary) hypertension: Secondary | ICD-10-CM

## 2020-12-14 DIAGNOSIS — H43811 Vitreous degeneration, right eye: Secondary | ICD-10-CM | POA: Diagnosis not present

## 2021-01-18 ENCOUNTER — Other Ambulatory Visit: Payer: Self-pay

## 2021-01-18 ENCOUNTER — Encounter (INDEPENDENT_AMBULATORY_CARE_PROVIDER_SITE_OTHER): Payer: Medicare HMO | Admitting: Ophthalmology

## 2021-01-18 DIAGNOSIS — H43811 Vitreous degeneration, right eye: Secondary | ICD-10-CM

## 2021-01-18 DIAGNOSIS — I10 Essential (primary) hypertension: Secondary | ICD-10-CM

## 2021-01-18 DIAGNOSIS — H34831 Tributary (branch) retinal vein occlusion, right eye, with macular edema: Secondary | ICD-10-CM

## 2021-01-18 DIAGNOSIS — H35033 Hypertensive retinopathy, bilateral: Secondary | ICD-10-CM

## 2021-01-24 ENCOUNTER — Other Ambulatory Visit: Payer: Self-pay | Admitting: Physician Assistant

## 2021-01-26 ENCOUNTER — Other Ambulatory Visit: Payer: Self-pay | Admitting: Cardiovascular Disease

## 2021-01-28 NOTE — Progress Notes (Signed)
Cardiology Office Note:    Date:  01/29/2021   ID:  David Pacheco, David Pacheco 09/02/1941, MRN [id number]  PCP:  Eber Hong, [place]  Cardiologist:  Sherren Mocha, MD   Advanced Practice Provider:  Liliane Shi, PA-C Electrophysiologist:  None       Referring MD: Eber Hong, MD   Chief Complaint:  Follow-up (HCM, AFib, CAD)    Patient Profile:    David Pacheco is a 80 y.o. male with:   Apical variant HCM ? cMRI 5/16: EF 72, apical HCM ? Echocardiogram 4/19: EF 60-65, apical LVH  Paroxysmal AFib CHA2DS2-VASc Score = 5 [CHF History: Yes, HTN History: Yes, Diabetes History: No, Stroke History: No, Vascular Disease History: Yes].  >> Apixaban    Monitor 10/21: no sig AF burden   Peripheral arterial disease  ? S/p prior L SFA stenting  Carotid artery dz ? [place] 9/18: Bilat ICA < 50%  Coronary artery disease (non-obs) ? Cath 12/09: pLAD 30, oOM1 30  Hypertension   Hyperlipidemia   Hx of DVT  Tobacco use  Prior CV studies: Echo 09/25/2020 Apical variant HCM, EF 70-75, no RWMA, GRII DD, normal RVSF, trivial MR  LONG TERM MONITOR (8-14 DAYS) INTERPRETATION 09/04/2020 Narrative 1. The basic rhythm is sinus bradycardia with an average HR of 53 bpm 2. No atrial fibrillation or flutter 3. No high-grade heart block or pathologic pauses 4. There are rare PVC's and frequent supraventricular beats (6%) without sustained arrhythmias 5. There are few short ventricular runs, longest 6 beats 6. There are few supraventricular runs, but none sustained   Echo 04/02/18 Mild conc LVH, apical hypertrophy, EF 60-65, no RWMA, Gr 2 DD, mild MAC, mild TR  Carotid US 09/01/2017 IMPRESSION: 1. Mild bilateral carotid bifurcation proximal ICA atherosclerotic vascular plaque. No flow limiting stenosis. Degree of stenosis less than 50% bilaterally. 2. Vertebrals are patent with antegrade flow. Repeat 1 year  ABIs/arterial US 09/01/2017 R 1.06/ L  0.98 IMPRESSION: Resting ABI the bilateral lower extremities within normal limits, with the Doppler waveform at the bilateral ankles demonstrating no significant arterial occlusive disease.  Cardiac MRI 5/16 IMPRESSION: 1. Normal LV systolic function, EF 24%. Morphology of the LV was consistent with apical hypertrophic cardiomyopathy. 2. LGE pattern consistent with apical hypertrophic cardiomyopathy.  Echo 4/16 Mild LVH, EF 60-65, indeterminate diastolic function, focal apical hypertrophy, MAC  LE arterial duplex/ABIs 4/16 Diffuse plaque throughout the left lower extremity, with mildly elevated velocities in the CFA. Widely patent left SFA stent. Normal ABIs  Carotid US 4/16 Bilateral ICA 1-39 FU 2 years  Myocardial Perfusion Imaging Study11/12 Septal thinning, no ischemia, EF 54  LHC 12/09 LAD proximal 30 LCx with OM1 ostial 30 EF 65  History of Present Illness:    Mr. Catena was last seen in 9/21.  He returns for f/u  He is here alone.  He brings in labs from his PCP done recently.  His Hgb was slightly low and his B12 and iron levels were somewhat low.  He was started on B12, folate and iron.  He has not had any hematochezia, hematuria.  He notes he had a colonoscopy a few years ago that was ok and he did not need any routine f/u.  He notes his PCP also checked for blood in his stool and this was neg (results are noted in [place]).  He has not had chest pain, syncope, shortness of breath, edema or orthopnea.  He does not  daytime sleepiness when he is not active.    Past Medical History:  Diagnosis Date  . Apical variant hypertrophic cardiomyopathy ([place])    Echo 4/19: mod conc LVH, apical hypertrophy, EF 60-65, no RWMA, Gr 2 DD, MAC, mild TR // Echo 10/21: Apical hypertrophic cardiomyopathy, EF 70-75, hyperdynamic function, no RWMA, GRII DD, normal RVSF, trivial MR  . CAD (coronary artery disease)    nonobstructive >> LHC 12/09: pLAD 30, oOM1 30, EF 65 // Nuc  study 11/12 Septal thinning, no ischemia, EF 54  . Carotid artery disease ([place]) 02/14/2017   Carotid US 4/16: Bilateral ICA 1-39 >> FU 2 years //carotid US 9/18: Bilateral ICA <50% >> follow-up 1 year  . DVT (deep venous thrombosis) ([place]) 2010   LLE  . Dyslipidemia   . Essential hypertension   . Paroxysmal atrial fibrillation (HCC)    noted on ECG during routine office visit // CHADS-VASc=5 // Apixaban Rx // Monitor 10/21: sinus brady, avg HR 53, rare PVCs, frequent supraventricular beats (6%), no sustained arrhythmia, few supraventricular runs and few short ventricular runs.   Marland Kitchen PVD (peripheral vascular disease) (HCC)    lower extremity PAD eith intermittent claudication //ABIs: R 1.06; L 0.98; Doppler waveforms demonstrate no significant arterial occlusive disease    Current Medications: Current Meds  Medication Sig  . amLODipine (NORVASC) 10 MG tablet Take 1 tablet (10 mg total) by mouth daily.  Marland Kitchen apixaban (ELIQUIS) 5 MG TABS tablet Take 1 tablet (5 mg total) by mouth 2 (two) times daily.  . bacitracin-polymyxin b (POLYSPORIN) ophthalmic ointment Place 1 application into the left eye 3 (three) times daily. apply to eye every 12 hours while awake  . brimonidine (ALPHAGAN) 0.2 % ophthalmic solution Place 1 drop into the left eye 2 (two) times daily.  . carvedilol (COREG) 12.5 MG tablet Take 1 tablet (12.5 mg total) by mouth 2 (two) times daily.  . Cholecalciferol (VITAMIN D PO) Take 1 capsule by mouth daily.  . cyanocobalamin (,VITAMIN B-12,) 1000 MCG/ML injection Inject 1,000 mcg into the muscle every 30 (thirty) days.  . dorzolamide (TRUSOPT) 2 % ophthalmic solution Place 1 drop into the left eye 3 (three) times daily.  . folic acid (FOLVITE) 1 MG tablet Take 1 mg by mouth daily.  Marland Kitchen gatifloxacin (ZYMAXID) 0.5 % SOLN Place 1 drop into the left eye 4 (four) times daily.  . Iron-Vitamin C (VITRON-C) 65-125 MG TABS Take 1 tablet by mouth daily.  Marland Kitchen lisinopril (ZESTRIL) 20 MG tablet TAKE 1  TABLET(20 MG) BY MOUTH DAILY  . pravastatin (PRAVACHOL) 80 MG tablet Take 1 tablet (80 mg total) by mouth daily. TAKE 1 TABLET BY MOUTH EVERY DAY     Allergies:   Patient has no known allergies.   Social History   Tobacco Use  . Smoking status: Current Every Day Smoker    Packs/day: 0.75    Years: 60.00    Pack years: 45.00    Types: Cigarettes  . Smokeless tobacco: Never Used  Vaping Use  . Vaping Use: Never used  Substance Use Topics  . Alcohol use: Yes    Comment: 12/12/2017 "may take a drink q 2-3 months"  . Drug use: No     Family Hx: The patient's family history includes Coronary artery disease in his brother; Heart attack in his brother; Hyperlipidemia in his father; Stroke in his mother.  Review of Systems  Respiratory: Negative for hemoptysis.   Gastrointestinal: Negative for hematochezia and melena.  Genitourinary: Negative for  hematuria.     EKGs/Labs/Other Test Reviewed:    EKG:  EKG is not ordered today.  The ekg ordered today demonstrates n/a  Recent Labs: 09/25/2020: BUN 11; Creatinine, Ser 0.91; Hemoglobin 13.1; Platelets 289; Potassium 4.1; Sodium 144   Labs from PCP [facility]) - personally reviewed and interpreted: 01/15/21: Hgb 13.5, K 4.4, SCr 1.06, LDL 54.4, ALT 8  Risk Assessment/Calculations:    CHA2DS2-VASc Score = 5  This indicates a 7.2% annual risk of stroke. The patient's score is based upon: CHF History: Yes HTN History: Yes Diabetes History: No Stroke History: No Vascular Disease History: Yes     Physical Exam:    VS:  BP (!) 114/50   Pulse 68   Ht _0  (1.702 m)   Wt 157 lb 6.4 oz (71.4 kg)   SpO2 98%   BMI 24.65 kg/m     Wt Readings from Last 3 Encounters:  01/29/21 157 lb 6.4 oz (71.4 kg)  08/14/20 156 lb (70.8 kg)  07/31/20 153 lb (69.4 kg)     Constitutional:      Appearance: Healthy appearance. Not in distress.  Neck:     Vascular: JVD normal.  Pulmonary:     Effort: Pulmonary effort is normal.      Breath sounds: No wheezing. No rales.  Cardiovascular:     Normal rate. Regular rhythm. Normal S1. Normal S2.     Murmurs: There is no murmur.  Edema:    Peripheral edema absent.  Abdominal:     Palpations: Abdomen is soft. There is no hepatomegaly.  Skin:    General: Skin is warm and dry.  Neurological:     General: No focal deficit present.     Mental Status: Alert and oriented to person, place and time.     Cranial Nerves: Cranial nerves are intact.       ASSESSMENT & PLAN:    1. Paroxysmal atrial fibrillation (HCC) He seems to be maintaining normal sinus rhythm on exam.  He did not have a significant AFib burden on his monitor.  He seems to be tolerating anticoagulation.  He is now on Iron, L27 and folic acid.  I have asked him to notify us or his PCP if he sees obvious signs of bleeding.  Continue current dose of Apixaban, Carvedilol   2. Hypertensive heart disease without CHF The patient's blood pressure is controlled on his current regimen.  Continue current therapy.    3. Apical variant hypertrophic cardiomyopathy ([place]) Recent echocardiogram with normal EF. Continue beta-blocker Rx.   4. Sleepiness 5. Hypersomnolence He lives alone and does not know if he snores.  He has a hx of atrial fibrillation and with daytime sleepiness, I have recommended that we test him for sleep apnea.  Arrange home sleep study.      Dispo:  Return in about 6 months (around 07/29/2021) for Routine Follow Up, w/ Dr. Burt Knack, or Richardson Dopp, PA-C, in person.   Medication Adjustments/Labs and Tests Ordered: Current medicines are reviewed at length with the patient today.  Concerns regarding medicines are outlined above.  Tests Ordered: Orders Placed This Encounter  Procedures  . Home sleep test   Medication Changes: No orders of the defined types were placed in this encounter.   Signed, Richardson Dopp, PA-C  01/29/2021 11:03 AM    [facility] [place],  [place], [place]  [address] Phone: [phone]; Fax: [phone]

## 2021-01-29 ENCOUNTER — Encounter: Payer: Self-pay | Admitting: Physician Assistant

## 2021-01-29 ENCOUNTER — Telehealth: Payer: Self-pay | Admitting: *Deleted

## 2021-01-29 ENCOUNTER — Other Ambulatory Visit: Payer: Self-pay

## 2021-01-29 ENCOUNTER — Ambulatory Visit: Payer: Medicare HMO | Admitting: Physician Assistant

## 2021-01-29 VITALS — BP 114/50 | HR 68 | Ht 67.0 in | Wt 157.4 lb

## 2021-01-29 DIAGNOSIS — I480 Paroxysmal atrial fibrillation: Secondary | ICD-10-CM | POA: Diagnosis not present

## 2021-01-29 DIAGNOSIS — R400 Somnolence: Secondary | ICD-10-CM | POA: Diagnosis not present

## 2021-01-29 DIAGNOSIS — I119 Hypertensive heart disease without heart failure: Secondary | ICD-10-CM | POA: Diagnosis not present

## 2021-01-29 DIAGNOSIS — G4710 Hypersomnia, unspecified: Secondary | ICD-10-CM

## 2021-01-29 DIAGNOSIS — I422 Other hypertrophic cardiomyopathy: Secondary | ICD-10-CM | POA: Diagnosis not present

## 2021-01-29 NOTE — Telephone Encounter (Signed)
-----   Message from Loa Socks, LPN sent at 3/00/9233 10:49 AM EST ----- Regarding: PT NEEDS HOME SLEEP STUDY PER SCOTT WEAVER PA-C [facility] PA-C saw this pt in clinic today and ordered for him to have a home sleep study done for afib, sleepiness, and hypersomnolence.   Order is in the system and the pt is aware you will be calling to arrange this test. Can you please call the pt and arrange and send Brien Mates CMA the date so she can place in her follow-up book?  Thanks so much, [employer]

## 2021-01-29 NOTE — Telephone Encounter (Signed)
Staff message sent to Nina ok to schedule HST. No PA is required. 

## 2021-01-29 NOTE — Telephone Encounter (Signed)
Staff message sent to Nina ok to schedule HST. Insurance does not require a PA. 

## 2021-01-29 NOTE — Patient Instructions (Signed)
Medication Instructions:   Your physician recommends that you continue on your current medications as directed. Please refer to the Current Medication list given to you today.  *If you need a refill on your cardiac medications before your next appointment, please call your pharmacy*    Testing/Procedures:  Your physician has recommended that you have a home sleep study. This test records several body functions during sleep, including: brain activity, eye movement, oxygen and carbon dioxide blood levels, heart rate and rhythm, breathing rate and rhythm, the flow of air through your mouth and nose, snoring, body muscle movements, and chest and belly movement.  YOU WILL BE GETTING A CALL FROM OUR SLEEP STUDY COORDINATOR NINA JONES, IN THE NEAR FUTURE, TO ARRANGE THIS TEST.  YOU WILL BE ASSIGNED TO ESTABLISH CARE FOR YOUR SLEEP STUDY TO OUR SLEEP PROVIDER HERE AT [facility], DR. TURNER   Follow-Up: At [facility], you and your health needs are our priority.  As part of our continuing mission to provide you with exceptional heart care, we have created designated Provider Care Teams.  These Care Teams include your primary Cardiologist (physician) and Advanced Practice Providers (APPs -  Physician Assistants and Nurse Practitioners) who all work together to provide you with the care you need, when you need it.  We recommend signing up for the patient portal called "MyChart".  Sign up information is provided on this After Visit Summary.  MyChart is used to connect with patients for Virtual Visits (Telemedicine).  Patients are able to view lab/test results, encounter notes, upcoming appointments, etc.  Non-urgent messages can be sent to your provider as well.   To learn more about what you can do with MyChart, go to [email or web address].    Your next appointment:   6 month(s)  The format for your next appointment:   In Person  Provider:   You may see Tonny Bollman, MD or one of the following  Advanced Practice Providers on your designated Care Team:    Tereso Newcomer, [place]

## 2021-02-12 NOTE — Telephone Encounter (Signed)
Informed patient of upcoming home sleep study and patient understanding was verbalized. Patient is scheduled for HST study on 03/07/21 @7 :30PM. Patient understands his sleep study will be done at [facility]. Patient understands she will receive a sleep packet in a week or so. Patient understands to call if he does not receive the sleep packet in a timely manner. Patient agrees with treatment and thanked me for call.

## 2021-02-12 NOTE — Telephone Encounter (Signed)
Waddell, Wanda M, CMA  Antwan Bribiesca G, CMA Ok to schedule HST. No PA is required.   

## 2021-02-21 ENCOUNTER — Other Ambulatory Visit: Payer: Self-pay | Admitting: Cardiovascular Disease

## 2021-02-22 ENCOUNTER — Encounter (INDEPENDENT_AMBULATORY_CARE_PROVIDER_SITE_OTHER): Payer: Medicare HMO | Admitting: Ophthalmology

## 2021-02-22 ENCOUNTER — Other Ambulatory Visit: Payer: Self-pay

## 2021-02-22 ENCOUNTER — Other Ambulatory Visit: Payer: Self-pay | Admitting: Cardiovascular Disease

## 2021-02-22 DIAGNOSIS — H34831 Tributary (branch) retinal vein occlusion, right eye, with macular edema: Secondary | ICD-10-CM

## 2021-02-22 DIAGNOSIS — H43811 Vitreous degeneration, right eye: Secondary | ICD-10-CM | POA: Diagnosis not present

## 2021-02-22 DIAGNOSIS — H35033 Hypertensive retinopathy, bilateral: Secondary | ICD-10-CM

## 2021-02-22 DIAGNOSIS — I10 Essential (primary) hypertension: Secondary | ICD-10-CM

## 2021-03-07 ENCOUNTER — Encounter: Payer: Medicare HMO | Admitting: Cardiology

## 2021-03-17 ENCOUNTER — Other Ambulatory Visit: Payer: Self-pay | Admitting: Physician Assistant

## 2021-03-18 NOTE — Telephone Encounter (Signed)
Prescription refill request for Eliquis received. Indication: PAF Last office visit: 01/29/21 Scr: 0.91 on 09/25/20 Age: 81 Weight: 71.4kg  Based on above findings Eliquis 5mg  twice daily is the appropriate dose.  Refill approved.

## 2021-03-30 ENCOUNTER — Encounter (INDEPENDENT_AMBULATORY_CARE_PROVIDER_SITE_OTHER): Payer: Medicare HMO | Admitting: Ophthalmology

## 2021-03-30 ENCOUNTER — Other Ambulatory Visit: Payer: Self-pay

## 2021-03-30 DIAGNOSIS — I10 Essential (primary) hypertension: Secondary | ICD-10-CM | POA: Diagnosis not present

## 2021-03-30 DIAGNOSIS — H35033 Hypertensive retinopathy, bilateral: Secondary | ICD-10-CM | POA: Diagnosis not present

## 2021-03-30 DIAGNOSIS — H34831 Tributary (branch) retinal vein occlusion, right eye, with macular edema: Secondary | ICD-10-CM | POA: Diagnosis not present

## 2021-03-30 DIAGNOSIS — H43811 Vitreous degeneration, right eye: Secondary | ICD-10-CM | POA: Diagnosis not present

## 2021-05-04 ENCOUNTER — Encounter (INDEPENDENT_AMBULATORY_CARE_PROVIDER_SITE_OTHER): Payer: Medicare HMO | Admitting: Ophthalmology

## 2021-05-17 ENCOUNTER — Encounter (INDEPENDENT_AMBULATORY_CARE_PROVIDER_SITE_OTHER): Payer: Medicare HMO | Admitting: Ophthalmology

## 2021-06-22 ENCOUNTER — Other Ambulatory Visit: Payer: Self-pay | Admitting: Physician Assistant

## 2021-06-22 NOTE — Telephone Encounter (Signed)
Prescription refill request for Eliquis received. Indication: Afib  Last office visit: David Pacheco 01/29/2021 Scr: 0.91, 09/25/2020 Age: 80 yo  Weight: 71.4 kg   Pt is on the correct dose of Eliquis per dosing criteria, prescription refill sent

## 2021-09-01 ENCOUNTER — Other Ambulatory Visit: Payer: Self-pay | Admitting: Cardiovascular Disease

## 2021-09-19 ENCOUNTER — Other Ambulatory Visit: Payer: Self-pay | Admitting: Cardiovascular Disease

## 2021-09-19 ENCOUNTER — Other Ambulatory Visit: Payer: Self-pay | Admitting: Physician Assistant

## 2021-10-26 ENCOUNTER — Telehealth: Payer: Self-pay

## 2021-10-26 NOTE — Telephone Encounter (Signed)
Letter has been sent to patient informing them that their sleep study has expired. Patient will need to call and schedule an office visit to re-evaluate the need for a sleep study.    

## 2021-12-20 ENCOUNTER — Other Ambulatory Visit: Payer: Self-pay | Admitting: Physician Assistant

## 2021-12-20 NOTE — Telephone Encounter (Signed)
Prescription refill request for Eliquis received. Indication: Afib  Last office visit:01/29/21  David Pacheco)  Scr: 0.98 (07/22/21)  Age: 81 Weight: 71.4kg  Appropriate dose and refill sent to requested pharmacy.

## 2022-01-27 ENCOUNTER — Other Ambulatory Visit: Payer: Self-pay | Admitting: Cardiovascular Disease

## 2022-01-28 ENCOUNTER — Other Ambulatory Visit: Payer: Self-pay | Admitting: Cardiovascular Disease

## 2022-02-11 ENCOUNTER — Other Ambulatory Visit: Payer: Self-pay | Admitting: Cardiovascular Disease

## 2022-02-28 ENCOUNTER — Other Ambulatory Visit: Payer: Self-pay | Admitting: Cardiovascular Disease

## 2022-02-28 ENCOUNTER — Other Ambulatory Visit: Payer: Self-pay | Admitting: Physician Assistant

## 2022-03-02 ENCOUNTER — Telehealth: Payer: Self-pay | Admitting: Physician Assistant

## 2022-03-02 MED ORDER — PRAVASTATIN SODIUM 80 MG PO TABS: 80.0000 mg | ORAL_TABLET | Freq: Every day | ORAL | 0 refills | Status: DC

## 2022-03-02 NOTE — Telephone Encounter (Signed)
?*  STAT* If patient is at the pharmacy, call can be transferred to refill team. ? ? ?1. Which medications need to be refilled? (please list name of each medication and dose if known) pravastatin (PRAVACHOL) 80 MG tablet ? ?2. Which pharmacy/location (including street and city if local pharmacy) is medication to be sent to? [facility] [phone] - [place], [place] - [address] AT [facility] [address] ? ?3. Do they need a 30 day or 90 day supply? 90  ?

## 2022-03-17 ENCOUNTER — Other Ambulatory Visit: Payer: Self-pay | Admitting: Cardiovascular Disease

## 2022-04-13 ENCOUNTER — Other Ambulatory Visit: Payer: Self-pay | Admitting: Cardiovascular Disease

## 2022-04-13 ENCOUNTER — Other Ambulatory Visit: Payer: Self-pay | Admitting: Physician Assistant

## 2022-04-25 ENCOUNTER — Ambulatory Visit: Payer: Medicare HMO | Admitting: Physician Assistant

## 2022-05-10 ENCOUNTER — Other Ambulatory Visit: Payer: Self-pay | Admitting: Cardiovascular Disease

## 2022-05-13 ENCOUNTER — Other Ambulatory Visit: Payer: Self-pay | Admitting: Cardiovascular Disease

## 2022-05-15 ENCOUNTER — Other Ambulatory Visit: Payer: Self-pay | Admitting: Cardiovascular Disease

## 2022-05-17 ENCOUNTER — Other Ambulatory Visit: Payer: Self-pay | Admitting: Cardiovascular Disease

## 2022-05-26 NOTE — Progress Notes (Addendum)
Cardiology Office Note:    Date:  05/27/2022   ID:  David Pacheco, David Pacheco 1941-06-11, MRN [id number]  PCP:  Kathlee Nations, MD  [facility] Providers Cardiologist:  Tonny Bollman, MD Cardiology APP:  Kennon Rounds    Referring MD: Kathlee Nations, MD   Chief Complaint:  Follow-up for A-fib, cardiomyopathy, CAD    Patient Profile: Apical variant HCM cMRI 5/16: EF 72, apical HCM Echocardiogram 4/19: EF 60-65, apical LVH Echo 10/21: EF 70-75, GRII DD, apical variant HCM Paroxysmal AFib CHA2DS2-VASc Score = 5 [CHF History: Yes, HTN History: Yes, Diabetes History: No, Stroke History: No, Vascular Disease History: Yes].  >> Apixaban    Monitor 10/21: no sig AF burden  Peripheral arterial disease  S/p prior L SFA stenting Carotid artery dz [place] 9/18: Bilat ICA < 50% Coronary artery disease (non-obs) Cath 12/09: pLAD 30, oOM1 30 Hypertension  Hyperlipidemia  Hx of DVT Tobacco use  Prior CV Studies: Echo 09/25/2020 Apical variant HCM, EF 70-75, no RWMA, GRII DD, normal RVSF, trivial MR   LONG TERM MONITOR (8-14 DAYS) INTERPRETATION 09/04/2020 Narrative 1. The basic rhythm is sinus bradycardia with an average HR of 53 bpm 2. No atrial fibrillation or flutter 3. No high-grade heart block or pathologic pauses 4. There are rare PVC's and frequent supraventricular beats (6%) without sustained arrhythmias 5. There are few short ventricular runs, longest 6 beats 6. There are few supraventricular runs, but none sustained   Echo 04/02/18 Mild conc LVH, apical hypertrophy, EF 60-65, no RWMA, Gr 2 DD, mild MAC, mild TR   Carotid US 09/01/2017 ICA stenosis <50% bilaterally   ABIs/arterial US 09/01/2017 R 1.06 / L 0.98   Cardiac MRI 5/16 IMPRESSION: 1. Normal LV systolic function, EF 72%. Morphology of the LV was consistent with apical hypertrophic cardiomyopathy. 2. LGE pattern consistent with apical hypertrophic cardiomyopathy.   Myocardial Perfusion Imaging Study 11/12 Septal  thinning, no ischemia, EF 54   LHC 12/09 LAD proximal 30 LCx with OM1 ostial 30 EF 65  History of Present Illness:   David Pacheco is a 81 y.o. male with the above problem list.  He was last seen in Feb 2022.  A sleep study was ordered at that time but was never done.  He returns for f/u.  He is here alone.  Overall, he has been doing well.  He has not had chest pain, shortness of breath, syncope, orthopnea, leg edema.  He continues to smoke.        Past Medical History:  Diagnosis Date   Apical variant hypertrophic cardiomyopathy (HCC)    Echo 4/19: mod conc LVH, apical hypertrophy, EF 60-65, no RWMA, Gr 2 DD, MAC, mild TR // Echo 10/21: Apical hypertrophic cardiomyopathy, EF 70-75, hyperdynamic function, no RWMA, GRII DD, normal RVSF, trivial MR   CAD (coronary artery disease)    nonobstructive >> LHC 12/09: pLAD 30, oOM1 30, EF 65 // Nuc study 11/12 Septal thinning, no ischemia, EF 54   Carotid artery disease (HCC) 02/14/2017   Carotid US 4/16: Bilateral ICA 1-39 >> FU 2 years //carotid US 9/18: Bilateral ICA <50% >> follow-up 1 year   DVT (deep venous thrombosis) (HCC) 2010   LLE   Dyslipidemia    Essential hypertension    Paroxysmal atrial fibrillation (HCC)    noted on ECG during routine office visit // CHADS-VASc=5 // Apixaban Rx // Monitor 10/21: sinus brady, avg HR 53, rare PVCs, frequent supraventricular beats (6%), no sustained arrhythmia, few  supraventricular runs and few short ventricular runs.    PVD (peripheral vascular disease) (HCC)    lower extremity PAD eith intermittent claudication //ABIs: R 1.06; L 0.98; Doppler waveforms demonstrate no significant arterial occlusive disease   Current Medications: Current Meds  Medication Sig   albuterol (VENTOLIN HFA) 108 (90 Base) MCG/ACT inhaler 1-2 puffs every 6 (six) hours as needed for wheezing or shortness of breath.   bacitracin-polymyxin b (POLYSPORIN) ophthalmic ointment Place 1 application into the left eye 3 (three)  times daily. apply to eye every 12 hours while awake   brimonidine (ALPHAGAN) 0.2 % ophthalmic solution Place 1 drop into the left eye 2 (two) times daily.   Cholecalciferol (VITAMIN D PO) Take 1 capsule by mouth daily.   cyanocobalamin (,VITAMIN B-12,) 1000 MCG/ML injection Inject 1,000 mcg into the muscle every 30 (thirty) days.   dorzolamide (TRUSOPT) 2 % ophthalmic solution Place 1 drop into the left eye 3 (three) times daily.   folic acid (FOLVITE) 1 MG tablet Take 1 mg by mouth daily.   gatifloxacin (ZYMAXID) 0.5 % SOLN Place 1 drop into the left eye 4 (four) times daily.   Iron-Vitamin C (VITRON-C) 65-125 MG TABS Take 1 tablet by mouth daily.   [DISCONTINUED] amLODipine (NORVASC) 10 MG tablet Take 1 tablet (10 mg total) by mouth daily. Keep May 27 2022 follow up appointment to receive anymore refills.   [DISCONTINUED] carvedilol (COREG) 12.5 MG tablet TAKE 1 TABLET(12.5 MG) BY MOUTH TWICE DAILY   [DISCONTINUED] ELIQUIS 5 MG TABS tablet TAKE 1 TABLET(5 MG) BY MOUTH TWICE DAILY   [DISCONTINUED] lisinopril (ZESTRIL) 20 MG tablet TAKE 1 TABLET(20 MG) BY MOUTH DAILY   [DISCONTINUED] pravastatin (PRAVACHOL) 80 MG tablet Take 1 tablet (80 mg total) by mouth daily. Please make yearly appt with Dr. Excell Seltzer for May 2023 for future refills. Thank you 2nd attempt    Allergies:   Patient has no known allergies.   Social History   Tobacco Use   Smoking status: Every Day    Packs/day: 0.75    Years: 60.00    Total pack years: 45.00    Types: Cigarettes   Smokeless tobacco: Never  Vaping Use   Vaping Use: Never used  Substance Use Topics   Alcohol use: Yes    Comment: 12/12/2017 "may take a drink q 2-3 months"   Drug use: No    Family Hx: The patient's family history includes Coronary artery disease in his brother; Heart attack in his brother; Hyperlipidemia in his father; Stroke in his mother.  Review of Systems  Respiratory:  Negative for hemoptysis.   Gastrointestinal:  Negative for  hematochezia.  Genitourinary:  Negative for hematuria.     EKGs/Labs/Other Test Reviewed:    EKG:  EKG is  ordered today.  The ekg ordered today demonstrates NSR, HR 67, normal axis, PAC, inferior and anterolateral T wave inversions, QTc 475, no change from prior tracing  Recent Labs: No results found for requested labs within last 365 days.   Recent Lipid Panel No results for input(s): "CHOL", "TRIG", "HDL", "VLDL", "LDLCALC", "LDLDIRECT" in the last 8760 hours.   Risk Assessment/Calculations/Metrics:    CHA2DS2-VASc Score =     This indicates a  % annual risk of stroke. The patient's score is based upon:              Physical Exam:    VS:  BP (!) 110/50   Pulse 67   Ht 5\' 6"  (1.676 m)  Wt 153 lb 3.2 oz (69.5 kg)   SpO2 98%   BMI 24.73 kg/m     Wt Readings from Last 3 Encounters:  05/27/22 153 lb 3.2 oz (69.5 kg)  01/29/21 157 lb 6.4 oz (71.4 kg)  08/14/20 156 lb (70.8 kg)    Constitutional:      Appearance: Healthy appearance. Not in distress.  Neck:     Vascular: No carotid bruit or JVR. JVD normal.  Pulmonary:     Effort: Pulmonary effort is normal.     Breath sounds: No wheezing. No rales.  Cardiovascular:     Normal rate. Regular rhythm. Normal S1. Normal S2.      Murmurs: There is no murmur.  Edema:    Peripheral edema absent.  Abdominal:     Palpations: Abdomen is soft.  Skin:    General: Skin is warm and dry.  Neurological:     General: No focal deficit present.     Mental Status: Alert and oriented to person, place and time.         ASSESSMENT & PLAN:   Apical variant hypertrophic cardiomyopathy (HCC) EF normal by echocardiogram in 2021.  Continue carvedilol 12.5 mg twice daily.  Follow-up in 1 year.  Non-obstructive CAD by Cardiac Cath in 2009 Mild nonobstructive disease by cardiac catheterization in 2009.  Low risk Myoview in 2012.  He is not having anginal symptoms.  He is not on aspirin as he is on Apixaban for anticoagulation.   Continue pravastatin 80 mg daily.  Follow-up in 1 year.  PAD (peripheral artery disease) (HCC) No claudication symptoms.  Continue pravastatin 80 mg daily.  Hyperlipidemia LDL goal <70 Labs through care everywhere personally reviewed and interpreted.  01/07/2022: Hgb 14.7, K+ 4.2, creatinine 1.07, ALT 8, LDL 57.6.  LDL optimal on most recent lab work.  Continue current Rx.    Hypertensive heart disease without CHF The patient's blood pressure is controlled on his current regimen.  Continue current therapy.   TOBACCO ABUSE I have recommended cessation.  Paroxysmal atrial fibrillation (HCC) Maintaining sinus rhythm.  He is tolerating anticoagulation.  Creatinine has remained less than 1.5 and his weight is over 60 kg.  Recent creatinine and hemoglobin normal.  Continue apixaban 5 mg twice daily.            Dispo:  Return in about 1 year (around 05/28/2023) for Routine Follow Up, w/ Dr. Excell Seltzer, or Tereso Newcomer, PA-C.   Medication Adjustments/Labs and Tests Ordered: Current medicines are reviewed at length with the patient today.  Concerns regarding medicines are outlined above.  Tests Ordered: Orders Placed This Encounter  Procedures   EKG 12-Lead   Medication Changes: Meds ordered this encounter  Medications   amLODipine (NORVASC) 10 MG tablet    Sig: Take 1 tablet (10 mg total) by mouth daily.    Dispense:  90 tablet    Refill:  3   carvedilol (COREG) 12.5 MG tablet    Sig: TAKE 1 TABLET(12.5 MG) BY MOUTH TWICE DAILY    Dispense:  180 tablet    Refill:  3   apixaban (ELIQUIS) 5 MG TABS tablet    Sig: TAKE 1 TABLET(5 MG) BY MOUTH TWICE DAILY    Dispense:  180 tablet    Refill:  3   lisinopril (ZESTRIL) 20 MG tablet    Sig: TAKE 1 TABLET(20 MG) BY MOUTH DAILY    Dispense:  90 tablet    Refill:  3   pravastatin (PRAVACHOL) 80  MG tablet    Sig: Take 1 tablet (80 mg total) by mouth daily.    Dispense:  90 tablet    Refill:  3   Signed, Tereso Newcomer, PA-C  05/27/2022 12:32  PM    [facility] [address] [place], [place], [place]  [address] Phone: [phone]; Fax: [phone]

## 2022-05-27 ENCOUNTER — Ambulatory Visit: Payer: Medicare HMO | Admitting: Physician Assistant

## 2022-05-27 ENCOUNTER — Encounter: Payer: Self-pay | Admitting: Physician Assistant

## 2022-05-27 VITALS — BP 110/50 | HR 67 | Ht 66.0 in | Wt 153.2 lb

## 2022-05-27 DIAGNOSIS — I739 Peripheral vascular disease, unspecified: Secondary | ICD-10-CM | POA: Diagnosis not present

## 2022-05-27 DIAGNOSIS — I480 Paroxysmal atrial fibrillation: Secondary | ICD-10-CM | POA: Diagnosis not present

## 2022-05-27 DIAGNOSIS — E785 Hyperlipidemia, unspecified: Secondary | ICD-10-CM

## 2022-05-27 DIAGNOSIS — F17200 Nicotine dependence, unspecified, uncomplicated: Secondary | ICD-10-CM

## 2022-05-27 DIAGNOSIS — I2510 Atherosclerotic heart disease of native coronary artery without angina pectoris: Secondary | ICD-10-CM

## 2022-05-27 DIAGNOSIS — I119 Hypertensive heart disease without heart failure: Secondary | ICD-10-CM

## 2022-05-27 DIAGNOSIS — I422 Other hypertrophic cardiomyopathy: Secondary | ICD-10-CM | POA: Diagnosis not present

## 2022-05-27 MED ORDER — AMLODIPINE BESYLATE 10 MG PO TABS: 10.0000 mg | ORAL_TABLET | Freq: Every day | ORAL | 3 refills | Status: DC

## 2022-05-27 MED ORDER — CARVEDILOL 12.5 MG PO TABS: ORAL_TABLET | ORAL | 3 refills | Status: DC

## 2022-05-27 MED ORDER — LISINOPRIL 20 MG PO TABS
ORAL_TABLET | ORAL | 3 refills | Status: DC
Start: 2022-05-27 — End: 2023-06-06

## 2022-05-27 MED ORDER — APIXABAN 5 MG PO TABS: ORAL_TABLET | ORAL | 3 refills | Status: DC

## 2022-05-27 MED ORDER — PRAVASTATIN SODIUM 80 MG PO TABS: 80.0000 mg | ORAL_TABLET | Freq: Every day | ORAL | 3 refills | Status: DC

## 2022-05-27 NOTE — Assessment & Plan Note (Signed)
No claudication symptoms.  Continue pravastatin 80 mg daily.

## 2022-05-29 ENCOUNTER — Other Ambulatory Visit: Payer: Self-pay | Admitting: Cardiovascular Disease

## 2022-11-24 ENCOUNTER — Other Ambulatory Visit: Payer: Self-pay

## 2022-11-24 DIAGNOSIS — I480 Paroxysmal atrial fibrillation: Secondary | ICD-10-CM

## 2022-11-24 MED ORDER — APIXABAN 5 MG PO TABS: ORAL_TABLET | ORAL | 3 refills | Status: DC

## 2022-11-24 NOTE — Telephone Encounter (Signed)
Prescription refill request for Eliquis received. Indication: Afib  Last office visit: 05/27/22 Alben Spittle)  Scr: 1.15 (07/20/22)  Age: 81 Weight: 69.5kg  Appropriate dose and refill sent to requested pharmacy.

## 2023-05-16 ENCOUNTER — Other Ambulatory Visit: Payer: Self-pay | Admitting: Physician Assistant

## 2023-06-04 ENCOUNTER — Other Ambulatory Visit: Payer: Self-pay | Admitting: Physician Assistant

## 2023-06-06 ENCOUNTER — Other Ambulatory Visit: Payer: Self-pay | Admitting: Physician Assistant

## 2023-06-23 ENCOUNTER — Other Ambulatory Visit: Payer: Self-pay | Admitting: Cardiovascular Disease

## 2023-06-28 ENCOUNTER — Other Ambulatory Visit: Payer: Self-pay | Admitting: Cardiovascular Disease

## 2023-07-20 ENCOUNTER — Other Ambulatory Visit: Payer: Self-pay | Admitting: Cardiovascular Disease

## 2023-07-24 NOTE — Progress Notes (Deleted)
Cardiology Office Note:    Date:  07/24/2023  ID:  Haki, Passley 04/06/1941, MRN [id number] PCP: Kathlee Nations, MD  [facility] Providers Cardiologist:  Tonny Bollman, MD Cardiology APP:  Beatrice Lecher, PA-C { Click to update primary MD,subspecialty MD or APP then REFRESH:1}    {Click to Open Review  :1}   Patient Profile:      Apical variant HCM cMRI 5/16: EF 72, apical HCM Echocardiogram 4/19: EF 60-65, apical LVH TTE 09/25/20: Apical variant HCM, EF 70-75, no RWMA, GRII DD, normal RVSF, trivial MR  Paroxysmal AFib CHA2DS2-VASc Score = 5 [CHF History: Yes, HTN History: Yes, Diabetes History: No, Stroke History: No, Vascular Disease History: Yes].  >> Apixaban    Monitor 10/21: no sig AF burden  Peripheral arterial disease  S/p prior L SFA stenting ABIs 08/2017: R 1.06/L 0.98 Carotid artery dz [place] 9/18: Bilat ICA < 50% Coronary artery disease (non-obs) Cath 12/09: pLAD 30, oOM1 30 Myoview 10/2011: no ischemia, EF 54 Hypertension  Hyperlipidemia  Hx of DVT Tobacco use       { FU of apical HCM, AFib, peripheral arterial disease, CAD  Last seen June 2023     :1}     Discussed the use of AI scribe software for clinical note transcription with the patient, who gave verbal consent to proceed.  History of Present Illness          ROS: ***    Studies Reviewed:       *** Risk Assessment/Calculations:   {Does this patient have ATRIAL FIBRILLATION?[phone]} No BP recorded.  {Refresh Note OR Click here to enter BP  :1}***       Physical Exam:   VS:  There were no vitals taken for this visit.   Wt Readings from Last 3 Encounters:  05/27/22 153 lb 3.2 oz (69.5 kg)  01/29/21 157 lb 6.4 oz (71.4 kg)  08/14/20 156 lb (70.8 kg)    Physical Exam***     Assessment and Plan:  No problem-specific Assessment & Plan notes found for this encounter. Assessment and Plan          {Apical variant hypertrophic cardiomyopathy (HCC) EF normal by echocardiogram in  2021.  Continue carvedilol 12.5 mg twice daily.  Follow-up in 1 year.   Non-obstructive CAD by Cardiac Cath in 2009 Mild nonobstructive disease by cardiac catheterization in 2009.  Low risk Myoview in 2012.  He is not having anginal symptoms.  He is not on aspirin as he is on Apixaban for anticoagulation.  Continue pravastatin 80 mg daily.  Follow-up in 1 year.   PAD (peripheral artery disease) (HCC) No claudication symptoms.  Continue pravastatin 80 mg daily.   Hyperlipidemia LDL goal <70 Labs through care everywhere personally reviewed and interpreted.  01/07/2022: Hgb 14.7, K+ 4.2, creatinine 1.07, ALT 8, LDL 57.6.  LDL optimal on most recent lab work.  Continue current Rx.     Hypertensive heart disease without CHF The patient's blood pressure is controlled on his current regimen.  Continue current therapy.    TOBACCO ABUSE I have recommended cessation.   Paroxysmal atrial fibrillation (HCC) Maintaining sinus rhythm.  He is tolerating anticoagulation.  Creatinine has remained less than 1.5 and his weight is over 60 kg.  Recent creatinine and hemoglobin normal.  Continue apixaban 5 mg twice daily.      :1}    {Are you ordering a CV Procedure (e.g. stress test, cath, DCCV, TEE, etc)?   Press  F2        :[id number]}  Dispo:  No follow-ups on file.  Signed, Tereso Newcomer, PA-C

## 2023-07-25 ENCOUNTER — Ambulatory Visit: Payer: Medicare HMO | Admitting: Physician Assistant

## 2023-07-25 DIAGNOSIS — I2510 Atherosclerotic heart disease of native coronary artery without angina pectoris: Secondary | ICD-10-CM

## 2023-07-25 DIAGNOSIS — I422 Other hypertrophic cardiomyopathy: Secondary | ICD-10-CM

## 2023-07-25 DIAGNOSIS — I480 Paroxysmal atrial fibrillation: Secondary | ICD-10-CM

## 2023-08-11 ENCOUNTER — Other Ambulatory Visit: Payer: Self-pay | Admitting: Physician Assistant

## 2023-08-12 ENCOUNTER — Other Ambulatory Visit: Payer: Self-pay | Admitting: Cardiovascular Disease

## 2023-09-02 ENCOUNTER — Other Ambulatory Visit: Payer: Self-pay | Admitting: Physician Assistant

## 2023-09-27 ENCOUNTER — Other Ambulatory Visit: Payer: Self-pay | Admitting: Physician Assistant

## 2023-10-18 ENCOUNTER — Ambulatory Visit: Payer: Medicare HMO | Admitting: Physician Assistant

## 2023-10-18 NOTE — Progress Notes (Deleted)
{This patient may be at risk for Amyloid. He has one or more dx on the problem list or PMH from the following list - Abnormal EKG, CHF, Aortic Stenosis, Proteinuria, LVH, Carpal Tunnel Syndrome, Biceps Tendon Rupture, Syncope. See list below or review PMH.  Diagnoses From Problem List           Noted     Abnormal EKG 09/30/2011    Click HERE to open Cardiac Amyloid Screening SmartSet to order screening OR Click HERE to defer testing for 1 year or permanently :1}    Cardiology Office Note:    Date:  10/18/2023  ID:  ARTEZ RIGONI, DOB 1941/02/01, MRN [id number] PCP: Kathlee Nations, MD  [facility] Providers Cardiologist:  Tonny Bollman, MD Cardiology APP:  Beatrice Lecher, PA-C { Click to update primary MD,subspecialty MD or APP then REFRESH:1}    {Click to Open Review  :1}   Patient Profile:      Apical variant HCM cMRI 5/16: EF 72, apical HCM Echocardiogram 4/19: EF 60-65, apical LVH TTE 09/25/2020: Apical variant HCM, EF 70-75, no RWMA, GRII DD, normal RVSF, trivial MR  Paroxysmal AFib CHA2DS2-VASc Score = 5 [CHF History: Yes, HTN History: Yes, Diabetes History: No, Stroke History: No, Vascular Disease History: Yes].  >> Apixaban    Monitor 10/21: no sig AF burden  Peripheral arterial disease  S/p prior L SFA stenting Carotid artery dz [place] 9/18: Bilat ICA < 50% Coronary artery disease (non-obs) Cath 12/09: pLAD 30, oOM1 30 Hypertension  Hyperlipidemia  Hx of DVT Tobacco use       {      :1}   History of Present Illness:  Discussed the use of AI scribe software for clinical note transcription with the patient, who gave verbal consent to proceed.  David Pacheco is a 82 y.o. male who returns for ***Discussed the use of AI scribe software for clinical note transcription with the patient, who gave verbal consent to proceed.  History of Present Illness            ROS   See HPI ***    Studies Reviewed:       *** Results   Care Everywhere 07/21/2023: TSH  4.718, LDL 47, triglycerides 77, total cholesterol 96, HDL 34, creatinine 1.03, ALT 12, K+ 4, Hgb 14.2        Risk Assessment/Calculations:   {Does this patient have ATRIAL FIBRILLATION?[phone]} No BP recorded.  {Refresh Note OR Click here to enter BP  :1}***       Physical Exam:   VS:  There were no vitals taken for this visit.   Wt Readings from Last 3 Encounters:  05/27/22 153 lb 3.2 oz (69.5 kg)  01/29/21 157 lb 6.4 oz (71.4 kg)  08/14/20 156 lb (70.8 kg)    Physical Exam***     Assessment and Plan:   Assessment & Plan Apical variant hypertrophic cardiomyopathy (HCC)  Paroxysmal atrial fibrillation (HCC)  Non-obstructive CAD by Cardiac Cath in 2009   Assessment and Plan          {  Apical variant hypertrophic cardiomyopathy (HCC) EF normal by echocardiogram in 2021.  Continue carvedilol 12.5 mg twice daily.  Follow-up in 1 year.   Non-obstructive CAD by Cardiac Cath in 2009 Mild nonobstructive disease by cardiac catheterization in 2009.  Low risk Myoview in 2012.  He is not having anginal symptoms.  He is not on aspirin as he is on Apixaban for anticoagulation.  Continue pravastatin 80 mg daily.  Follow-up in 1 year.   PAD (peripheral artery disease) (HCC) No claudication symptoms.  Continue pravastatin 80 mg daily.   Hyperlipidemia LDL goal <70 Labs through care everywhere personally reviewed and interpreted.  01/07/2022: Hgb 14.7, K+ 4.2, creatinine 1.07, ALT 8, LDL 57.6.  LDL optimal on most recent lab work.  Continue current Rx.     Hypertensive heart disease without CHF The patient's blood pressure is controlled on his current regimen.  Continue current therapy.    TOBACCO ABUSE I have recommended cessation.   Paroxysmal atrial fibrillation (HCC) Maintaining sinus rhythm.  He is tolerating anticoagulation.  Creatinine has remained less than 1.5 and his weight is over 60 kg.  Recent creatinine and hemoglobin normal.  Continue apixaban 5 mg twice daily.       :1}    {Are you ordering a CV Procedure (e.g. stress test, cath, DCCV, TEE, etc)?   Press F2        :[id number]}  Dispo:  No follow-ups on file.  Signed, Tereso Newcomer, PA-C

## 2023-10-19 ENCOUNTER — Other Ambulatory Visit: Payer: Self-pay | Admitting: Physician Assistant

## 2023-11-09 ENCOUNTER — Other Ambulatory Visit: Payer: Self-pay | Admitting: Cardiovascular Disease

## 2023-11-15 ENCOUNTER — Other Ambulatory Visit: Payer: Self-pay | Admitting: *Deleted

## 2023-11-15 DIAGNOSIS — I480 Paroxysmal atrial fibrillation: Secondary | ICD-10-CM

## 2023-11-15 MED ORDER — APIXABAN 5 MG PO TABS: ORAL_TABLET | ORAL | 1 refills | Status: DC

## 2023-11-15 NOTE — Telephone Encounter (Signed)
Eliquis mg refill request received. Patient is 82 years old, weight-69.5kg, Crea-1.03 on 07/21/23 via Care Everywhere from [facility], [place], and last seen by Tereso Newcomer on 05/27/22 and pending appt on 12/29/23. Dose is appropriate based on dosing criteria.

## 2023-12-02 ENCOUNTER — Other Ambulatory Visit: Payer: Self-pay | Admitting: Cardiovascular Disease

## 2023-12-27 ENCOUNTER — Other Ambulatory Visit: Payer: Self-pay | Admitting: Cardiovascular Disease

## 2023-12-29 ENCOUNTER — Ambulatory Visit: Payer: Medicare HMO | Admitting: Physician Assistant

## 2023-12-29 ENCOUNTER — Other Ambulatory Visit: Payer: Self-pay | Admitting: Cardiovascular Disease

## 2023-12-31 ENCOUNTER — Other Ambulatory Visit: Payer: Self-pay | Admitting: Cardiovascular Disease

## 2024-01-02 ENCOUNTER — Ambulatory Visit: Payer: Medicare HMO | Admitting: Cardiovascular Disease

## 2024-01-02 NOTE — Telephone Encounter (Signed)
Pt has an upcoming appt in April 2025 with Dr. Excell Seltzer. Pt has had numerous cancellations. Pt has an upcoming appt in April 2025. Does pt need to be seen sooner or does Dr. Excell Seltzer want to refill pt's medication pravastatin 80 mg until appt time? Please address

## 2024-01-05 ENCOUNTER — Encounter: Payer: Self-pay | Admitting: Cardiovascular Disease

## 2024-01-05 ENCOUNTER — Ambulatory Visit: Payer: Medicare HMO | Attending: Cardiovascular Disease | Admitting: Cardiovascular Disease

## 2024-01-05 VITALS — BP 120/60 | HR 59 | Ht 66.0 in | Wt 150.0 lb

## 2024-01-05 DIAGNOSIS — I480 Paroxysmal atrial fibrillation: Secondary | ICD-10-CM | POA: Diagnosis not present

## 2024-01-05 DIAGNOSIS — E782 Mixed hyperlipidemia: Secondary | ICD-10-CM

## 2024-01-05 DIAGNOSIS — I119 Hypertensive heart disease without heart failure: Secondary | ICD-10-CM | POA: Diagnosis not present

## 2024-01-05 DIAGNOSIS — F17200 Nicotine dependence, unspecified, uncomplicated: Secondary | ICD-10-CM | POA: Diagnosis not present

## 2024-01-05 DIAGNOSIS — I422 Other hypertrophic cardiomyopathy: Secondary | ICD-10-CM

## 2024-01-05 NOTE — Assessment & Plan Note (Signed)
Cessation counseling done.  He has been smoking for many years.  Currently smoking 1/2 pack/day, not ready to quit.

## 2024-01-05 NOTE — Assessment & Plan Note (Signed)
Previous cardiac MRI and echo studies reviewed.  Patient has had no symptoms of heart failure.  He remains on carvedilol.  Recommend follow-up in 1 year.

## 2024-01-05 NOTE — Assessment & Plan Note (Signed)
Blood pressure well-controlled.  Continue amlodipine, carvedilol, and lisinopril.  No changes are made today.

## 2024-01-05 NOTE — Progress Notes (Signed)
Cardiology Office Note:    Date:  01/05/2024   ID:  David Pacheco, David Pacheco 07-Jan-1941, MRN [id number]  PCP:  Kathlee Nations, MD   [facility] Providers Cardiologist:  Tonny Bollman, MD Cardiology APP:  Kennon Rounds     Referring MD: Kathlee Nations, MD   Chief Complaint  Patient presents with   Follow-up    cardiomyopathy    History of Present Illness:    David Pacheco is a 83 y.o. 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  >> Apixaban    Monitor 10/21: no sig AF burden  Peripheral arterial disease  S/p prior L SFA stenting Carotid artery dz [place] 9/18: Bilat ICA < 50% Coronary artery disease (non-obs) Cath 12/09: pLAD 30, oOM1 30 Hypertension  Hyperlipidemia  Hx of DVT Tobacco use, 1/2 ppd  The patient is here alone today.  I have not seen him in 8 years, but he has been following regularly with Tereso Newcomer.  He denies any change in cardiac symptoms.  He has no shortness of breath, orthopnea, PND, chest pain, or heart palpitations.  He feels like he has been doing well.  His only complaint is that he develops phlegm in his throat and a cough when he lies down at night.  He denies orthopnea but does complain of a cough that feels like it originates from the throat area.  He has no other complaints at this time.  Denies fevers or chills.  Current Medications: Current Meds  Medication Sig   albuterol (VENTOLIN HFA) 108 (90 Base) MCG/ACT inhaler 1-2 puffs every 6 (six) hours as needed for wheezing or shortness of breath.   amLODipine (NORVASC) 10 MG tablet TAKE 1 TABLET (10 MG) BY MOUTH DAILY.   apixaban (ELIQUIS) 5 MG TABS tablet TAKE 1 TABLET(5 MG) BY MOUTH TWICE DAILY   carvedilol (COREG) 12.5  MG tablet Take 1 tablet (12.5 mg total) by mouth 2 (two) times daily. Must keep scheduled appointment for future refills. Thank you.   Cholecalciferol (VITAMIN D PO) Take 1 capsule by mouth daily.   cyanocobalamin (,VITAMIN B-12,) 1000 MCG/ML injection Inject 1,000 mcg into the muscle every 30 (thirty) days.   folic acid (FOLVITE) 1 MG tablet Take 1 mg by mouth daily.   lisinopril (ZESTRIL) 20 MG tablet Take 1 tablet (20 mg total) by mouth daily. Must keep scheduled appointment for future refills. Thank you.   pravastatin (PRAVACHOL) 80 MG tablet TAKE 1 TABLET BY MOUTH EVERY DAY   [DISCONTINUED] bacitracin-polymyxin b (POLYSPORIN) ophthalmic ointment Place 1 application into the left eye 3 (three) times daily. apply to eye every 12 hours while awake   [DISCONTINUED] brimonidine (ALPHAGAN) 0.2 % ophthalmic solution Place 1 drop into the left eye 2 (two) times daily.   [DISCONTINUED] dorzolamide (TRUSOPT) 2 % ophthalmic solution Place 1 drop into the left eye 3 (three) times daily.   [DISCONTINUED] gatifloxacin (ZYMAXID) 0.5 % SOLN Place 1 drop into the left eye 4 (four) times daily.   [DISCONTINUED] Iron-Vitamin C (VITRON-C) 65-125 MG TABS Take 1 tablet by mouth daily.     Allergies:   Patient has no known allergies.   ROS:   Please see the history of present illness.  All other systems reviewed and are negative.  EKGs/Labs/Other Studies Reviewed:    The following studies were reviewed today: Cardiac Studies & Procedures      ECHOCARDIOGRAM  ECHOCARDIOGRAM COMPLETE 09/25/2020  Narrative ECHOCARDIOGRAM REPORT    Patient Name:   KALED ALLENDE Date of Exam: 09/25/2020 Medical Rec #:  [id number]      Height:       66.0 in Accession #:    [id number]     Weight:       156.0 lb Date of Birth:  June 25, 1941     BSA:          1.799 m Patient Age:    78 years       BP:           130/50 mmHg Patient Gender: M              HR:           51 bpm. Exam Location:  [address]  Procedure:  2D Echo, Cardiac Doppler, Color Doppler and Intracardiac Opacification Agent  Indications:    I42.2 Apical variant hypertrophic cardiomyopathy  History:        Patient has prior history of Echocardiogram examinations, most recent 04/02/2018. Abnormal ECG, PAD, Arrythmias:Atrial Fibrillation; Risk Factors:Current Smoker, Hypertension and Dyslipidemia. Non-obstructive CAD. Carotid artery disease.  Sonographer:    Cathie Beams RCS Referring Phys: [id number] Evern Bio WEAVER  IMPRESSIONS   1. Apical hypertrophic cardiomyopathy. Left ventricular ejection fraction, by estimation, is 70 to 75%. The left ventricle has hyperdynamic function. The left ventricle has no regional wall motion abnormalities. There is severe left ventricular hypertrophy of the apical segment. Left ventricular diastolic parameters are consistent with Grade II diastolic dysfunction (pseudonormalization). 2. Right ventricular systolic function is normal. The right ventricular size is normal. 3. The mitral valve is normal in structure. Trivial mitral valve regurgitation. No evidence of mitral stenosis. 4. The aortic valve is normal in structure. Aortic valve regurgitation is not visualized. No aortic stenosis is present. 5. The inferior vena cava is normal in size with greater than 50% respiratory variability, suggesting right atrial pressure of 3 mmHg.  Comparison(s): No significant change from prior study. Prior images reviewed side by side.  FINDINGS Left Ventricle: Apical hypertrophic cardiomyopathy. Left ventricular ejection fraction, by estimation, is 70 to 75%. The left ventricle has hyperdynamic function. The left ventricle has no regional wall motion abnormalities. The left ventricular internal cavity size was normal in size. There is severe left ventricular hypertrophy of the apical segment. Left ventricular diastolic parameters are consistent with Grade II diastolic dysfunction (pseudonormalization).  Right Ventricle:  The right ventricular size is normal. No increase in right ventricular wall thickness. Right ventricular systolic function is normal.  Left Atrium: Left atrial size was normal in size.  Right Atrium: Right atrial size was normal in size.  Pericardium: There is no evidence of pericardial effusion.  Mitral Valve: The mitral valve is normal in structure. Trivial mitral valve regurgitation. No evidence of mitral valve stenosis.  Tricuspid Valve: The tricuspid valve is normal in structure. Tricuspid valve regurgitation is not demonstrated. No evidence of tricuspid stenosis.  Aortic Valve: The aortic valve is normal in structure. Aortic valve regurgitation is not visualized. No aortic stenosis is present.  Pulmonic Valve: The pulmonic valve was normal in structure. Pulmonic valve regurgitation is trivial. No evidence of pulmonic stenosis.  Aorta: The aortic root is normal in size and structure.  Venous: The inferior vena cava is normal  in size with greater than 50% respiratory variability, suggesting right atrial pressure of 3 mmHg.  IAS/Shunts: No atrial level shunt detected by color flow Doppler.   LEFT VENTRICLE PLAX 2D LVIDd:         5.00 cm  Diastology LVIDs:         2.90 cm  LV e' medial:    3.09 cm/s LV PW:         1.20 cm  LV E/e' medial:  26.8 LV IVS:        1.10 cm  LV e' lateral:   5.29 cm/s LVOT diam:     2.10 cm  LV E/e' lateral: 15.6 LV SV:         83 LV SV Index:   46 LVOT Area:     3.46 cm   RIGHT VENTRICLE RV Basal diam:  2.10 cm RV S prime:     14.80 cm/s TAPSE (M-mode): 2.4 cm RVSP:           24.5 mmHg  LEFT ATRIUM             Index       RIGHT ATRIUM           Index LA diam:        4.00 cm 2.22 cm/m  RA Pressure: 3.00 mmHg LA Vol (A2C):   53.9 ml 29.95 ml/m RA Area:     17.50 cm LA Vol (A4C):   50.8 ml 28.23 ml/m RA Volume:   49.10 ml  27.29 ml/m LA Biplane Vol: 52.6 ml 29.23 ml/m AORTIC VALVE LVOT Vmax:   121.00 cm/s LVOT Vmean:  68.200 cm/s LVOT  VTI:    0.239 m  AORTA Ao Root diam: 2.70 cm  MITRAL VALVE               TRICUSPID VALVE MV Area (PHT): 4.83 cm    TR Peak grad:   21.5 mmHg MV Decel Time: 157 msec    TR Vmax:        232.00 cm/s MV E velocity: 82.70 cm/s  Estimated RAP:  3.00 mmHg MV A velocity: 57.80 cm/s  RVSP:           24.5 mmHg MV E/A ratio:  1.43 SHUNTS Systemic VTI:  0.24 m Systemic Diam: 2.10 cm  Donato Schultz MD Electronically signed by Donato Schultz MD Signature Date/Time: 09/25/2020/12:30:52 PM    Final   MONITORS  LONG TERM MONITOR (3-14 DAYS) 09/04/2020  Narrative 1. The basic rhythm is sinus bradycardia with an average HR of 53 bpm 2. No atrial fibrillation or flutter 3. No high-grade heart block or pathologic pauses 4. There are rare PVC's and frequent supraventricular beats (6%) without sustained arrhythmias 5. There are few short ventricular runs, longest 6 beats 6. There are few supraventricular runs, but none sustained   CARDIAC MRI  MR CARDIAC MORPHOLOGY W WO CONTRAST 04/22/2015  Narrative CLINICAL DATA:  Assess for apical hypertrophic cardiomyopathy  EXAM: CARDIAC MRI  TECHNIQUE: The patient was scanned on a 1.5 Tesla GE magnet. A dedicated cardiac coil was used. Functional imaging was done using Fiesta sequences. 2,3, and 4 chamber views were done to assess for RWMA's. Modified Simpson's rule using a short axis stack was used to calculate an ejection fraction on a dedicated work [employer]. The patient received 28 cc of Multihance. After 10 minutes inversion recovery sequences were used to assess for infiltration and scar tissue.  FINDINGS: Limited views of the  lung fields showed no gross abnormalities.  Normal left ventricular size with marked asymmetric hypertrophy of the apical wall segments consistent with apical hypertrophic cardiomyopathy. The basal segments were normal in thickness. There was no systolic anterior motion of the mitral valve or  evidence for LV outflow tract gradient. 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 Normal LV wall motion with EF 72%. The right ventricle was normal in size and systolic function. The atria were normal sizes. There was no significant mitral regurgitation. The aortic valve was trileaflet with no significant regurgitation or stenosis.  On delayed enhancement imaging, there was mid-wall late gadolinium enhancement (LGE) in the apical septum and the apical inferior wall.  MEASUREMENTS: MEASUREMENTS LV EDV 123 mL  LV SV 88 mL  LV EF 72%  IMPRESSION: 1. Normal LV systolic function, EF 72%. Morphology of the LV was consistent with apical hypertrophic cardiomyopathy.  2.  LGE pattern consistent with apical hypertrophic cardiomyopathy.  Dalton Mclean   Electronically Signed By: Marca Ancona M.D. On: 04/22/2015 17:11         EKG:   EKG Interpretation Date/Time:  Friday January 05 2024 13:41:57 EST Ventricular Rate:  59 PR Interval:  164 QRS Duration:  102 QT Interval:  474 QTC Calculation: 469 R Axis:   70  Text Interpretation: Sinus bradycardia with Blocked Premature atrial complexes Minimal voltage criteria for LVH, may be normal variant ( Sokolow-Lyon ) Marked T wave abnormality, consider anterolateral ischemia Prolonged QT When compared with ECG of 12-Dec-2017 11:08, Premature atrial complexes are now Present Confirmed by Tonny Bollman [phone]) on 01/05/2024 1:56:01 PM    Recent Labs: No results found for requested labs within last 365 days.  Recent Lipid Panel    Component Value Date/Time   CHOL  12/02/2008 0500    146        ATP III CLASSIFICATION:  <200     mg/dL   Desirable  191-478  mg/dL   Borderline High  >=295    mg/dL   High   TRIG 89 62/13/0865 0500   HDL 22 (L) 12/02/2008 0500   CHOLHDL 6.6 12/02/2008 0500   VLDL 18 12/02/2008 0500   LDLCALC (H) 12/02/2008 0500    106         Total Cholesterol/HDL:CHD Risk Coronary Heart Disease Risk Table                     Men   Women  1/2 Average Risk   3.4   3.3     Risk Assessment/Calculations:                Physical Exam:    VS:  BP 120/60   Pulse (!) 59   Ht 5\' 6"  (1.676 m)   Wt 150 lb (68 kg)   SpO2 97%   BMI 24.21 kg/m     Wt Readings from Last 3 Encounters:  01/05/24 150 lb (68 kg)  05/27/22 153 lb 3.2 oz (69.5 kg)  01/29/21 157 lb 6.4 oz (71.4 kg)     GEN:  Well nourished, well developed in no acute distress HEENT: Normal NECK: No JVD; No carotid bruits LYMPHATICS: No lymphadenopathy CARDIAC: RRR, no murmurs, rubs, gallops RESPIRATORY:  Clear to auscultation without rales, wheezing or rhonchi  ABDOMEN: Soft, non-tender, non-distended MUSCULOSKELETAL:  No edema; No deformity  SKIN: Warm and dry NEUROLOGIC:  Alert and oriented x 3 PSYCHIATRIC:  Normal affect   Assessment & Plan Hypertensive heart disease without CHF Blood pressure well-controlled.  Continue amlodipine, carvedilol, and lisinopril.  No changes are made today. Apical variant hypertrophic cardiomyopathy (HCC) Previous cardiac MRI and echo studies reviewed.  Patient has had no symptoms of heart failure.  He remains on carvedilol.  Recommend follow-up in 1 year. Paroxysmal atrial fibrillation (HCC) Maintaining sinus rhythm, frequent PACs noted on today's EKG tracing.  Continue beta-blocker, continue apixaban for anticoagulation. TOBACCO ABUSE Cessation counseling done.  He has been smoking for many years.  Currently smoking 1/2 pack/day, not ready to quit. Mixed hyperlipidemia Treated with pravastatin.  Lipids reviewed with cholesterol 96, LDL 47.      Medication Adjustments/Labs and Tests Ordered: Current medicines are reviewed at length with the patient today.  Concerns regarding medicines are outlined above.  Orders Placed This Encounter  Procedures   EKG 12-Lead   No orders of the defined types were placed in this  encounter.   There are no Patient Instructions on file for this visit.   Signed, Tonny Bollman, MD  01/05/2024 2:06 PM    [facility]

## 2024-01-05 NOTE — Assessment & Plan Note (Signed)
Maintaining sinus rhythm, frequent PACs noted on today's EKG tracing.  Continue beta-blocker, continue apixaban for anticoagulation.

## 2024-01-14 ENCOUNTER — Other Ambulatory Visit: Payer: Self-pay | Admitting: Physician Assistant

## 2024-01-19 ENCOUNTER — Other Ambulatory Visit: Payer: Self-pay | Admitting: Cardiovascular Disease

## 2024-01-26 ENCOUNTER — Other Ambulatory Visit: Payer: Self-pay | Admitting: Cardiovascular Disease

## 2024-02-18 ENCOUNTER — Other Ambulatory Visit: Payer: Self-pay | Admitting: Cardiovascular Disease

## 2024-03-12 ENCOUNTER — Ambulatory Visit: Payer: Medicare HMO | Admitting: Cardiovascular Disease

## 2024-05-06 ENCOUNTER — Other Ambulatory Visit: Payer: Self-pay | Admitting: Physician Assistant

## 2024-05-06 DIAGNOSIS — I480 Paroxysmal atrial fibrillation: Secondary | ICD-10-CM

## 2024-10-29 ENCOUNTER — Other Ambulatory Visit: Payer: Self-pay | Admitting: Physician Assistant

## 2024-10-29 DIAGNOSIS — I480 Paroxysmal atrial fibrillation: Secondary | ICD-10-CM

## 2024-10-29 MED ORDER — APIXABAN 5 MG PO TABS: 5.0000 mg | ORAL_TABLET | Freq: Two times a day (BID) | ORAL | 1 refills | Status: AC

## 2024-10-29 NOTE — Telephone Encounter (Signed)
 Prescription refill request for Eliquis  received. Indication:afib Last office visit:1/25 Scr: 0.98  9/25 Age:83 Weight:68  kg  Prescription refilled

## 2024-12-11 ENCOUNTER — Other Ambulatory Visit: Payer: Self-pay | Admitting: Cardiovascular Disease
